# Patient Record
Sex: Female | Born: 1975 | Race: White | Hispanic: No | Marital: Married | Smoking: Never smoker
Health system: Southern US, Community
[De-identification: ages and names within clinical notes are randomized; demographics above are authoritative.]

## PROBLEM LIST (undated history)

## (undated) DIAGNOSIS — E079 Disorder of thyroid, unspecified: Secondary | ICD-10-CM

## (undated) DIAGNOSIS — F329 Major depressive disorder, single episode, unspecified: Secondary | ICD-10-CM

## (undated) DIAGNOSIS — J329 Chronic sinusitis, unspecified: Secondary | ICD-10-CM

## (undated) DIAGNOSIS — Z309 Encounter for contraceptive management, unspecified: Secondary | ICD-10-CM

## (undated) DIAGNOSIS — F419 Anxiety disorder, unspecified: Secondary | ICD-10-CM

## (undated) DIAGNOSIS — F32A Depression, unspecified: Secondary | ICD-10-CM

## (undated) HISTORY — DX: Anxiety disorder, unspecified: F41.9

## (undated) HISTORY — DX: Chronic sinusitis, unspecified: J32.9

## (undated) HISTORY — PX: CHOLECYSTECTOMY: SHX55

## (undated) HISTORY — DX: Unspecified disorder of calcium metabolism: E83.50

## (undated) HISTORY — DX: Encounter for contraceptive management, unspecified: Z30.9

## (undated) HISTORY — DX: Disorder of thyroid, unspecified: E07.9

---

## 2002-08-20 ENCOUNTER — Encounter: Payer: Self-pay | Admitting: Family Medicine

## 2002-08-20 ENCOUNTER — Encounter: Admission: RE | Admit: 2002-08-20 | Discharge: 2002-08-20 | Payer: Self-pay | Admitting: Family Medicine

## 2002-10-06 ENCOUNTER — Encounter: Payer: Self-pay | Admitting: General Surgery

## 2002-10-06 ENCOUNTER — Encounter (INDEPENDENT_AMBULATORY_CARE_PROVIDER_SITE_OTHER): Payer: Self-pay | Admitting: Specialist

## 2002-10-06 ENCOUNTER — Observation Stay (HOSPITAL_COMMUNITY): Admission: RE | Admit: 2002-10-06 | Discharge: 2002-10-06 | Payer: Self-pay | Admitting: General Surgery

## 2005-07-10 HISTORY — PX: EYE SURGERY: SHX253

## 2009-01-12 ENCOUNTER — Other Ambulatory Visit: Admission: RE | Admit: 2009-01-12 | Discharge: 2009-01-12 | Payer: Self-pay | Admitting: Obstetrics and Gynecology

## 2010-02-07 ENCOUNTER — Other Ambulatory Visit: Admission: RE | Admit: 2010-02-07 | Discharge: 2010-02-07 | Payer: Self-pay | Admitting: Obstetrics and Gynecology

## 2010-11-25 NOTE — Op Note (Signed)
NAME:  Andrea Liu, Andrea Liu                         ACCOUNT NO.:  [id number]   MEDICAL RECORD NO.:  [id number]                   PATIENT TYPE:  AMB   LOCATION:  DAY                                  FACILITY:  [facility]   PHYSICIAN:  Timothy E. Earlene Plater, M.D.              DATE OF BIRTH:  Nov 30, 1975   DATE OF PROCEDURE:  10/06/2002  DATE OF DISCHARGE:                                 OPERATIVE REPORT   PREOPERATIVE DIAGNOSIS:  Cholecystolithiasis.   POSTOPERATIVE DIAGNOSIS:  Cholecystolithiasis.   PROCEDURE:  Laparoscopic cholecystectomy and operative cholangiogram.   SURGEON:  Timothy E. Earlene Plater, M.D.   ASSISTANT:  Donnie Coffin. Samuella Cota, M.D.   ANESTHESIA:  CRNA supervised, M.D.   INDICATIONS FOR PROCEDURE:  The patient is 65, otherwise healthy, recent  postpartum, development of symptomatic gallstones documented by ultrasound.  Today her CBC and chemistry profile are normal and pregnancy test is  negative.  She is identified with permit signed.   PROCEDURE:  She was taken to the operating room, placed supine, general  endotracheal anesthesia administered.  The abdomen was prepped and draped in  the usual fashion.  Marcaine 0.25% with epinephrine was used  prior to each  incision.  A vertical infraumbilical incision made.  The fascia identified,  opened vertically.  The peritoneum entered.  The Hasson catheter placed,  tied in place with a 1 Vicryl stitch.  The abdomen insufflated.  General  peritoneoscopy was essentially negative.  There was some blood in the pelvis  that did not come from the Hasson placement.  A second 10-mm trocar placed  in the mid epigastrium, two 5-mm trocars in the right upper quadrant.  The  gallbladder was thickened, it was grasped.  The omental adhesions were taken  down.  Careful evaluation of the infundibulum of the gallbladder revealed a  long, normal appearing cystic duct entering the infundibulum of the  gallbladder.  This was dissected free and a complete window  was made.  An  adjoining anterior artery was separately distended, triply clipped, and  divided.  A clip was placed on the gallbladder side of the cystic duct.  The  cystic duct was opened and then percutaneously a Reddick catheter was placed  into the cystic duct stump.  Then using real-time fluoroscopy, Hypaque half  strength was injected under a real-time fluoroscopy.  The dye flowed freely  and filled the biliary tree completely.  There was no abnormality or  obstruction.  The catheter was removed.  The cystic duct stump was triply  clamped and it was fully divided.  One vascular structure in the bed of the  gallbladder was singly clipped.  The gallbladder was removed from the  gallbladder bed without incident or complication.  There was no bile or  blood.  Irrigant was completely clear.  The gallbladder was removed through  the infraumbilical incision and delivered off of the field, that suture  tied.  Copious irrigation was carried out including the pelvis and all was  clear.  No further bleeding was seen in the pelvis or pooling of blood.  All  instruments, trocars, CO2, and irrigant were  removed under direct vision.  Each skin incision was closed with 3-0  Monocryl and Steri-Strips.  Dry sterile dressing was applied, counts  correct.  She tolerated it well, was awakened and taken to the recovery room  in good condition.                                               Timothy E. Earlene Plater, M.D.    TED/MEDQ  D:  10/06/2002  T:  10/06/2002  Job:  [id number]   cc:   Loney Loh

## 2011-02-27 ENCOUNTER — Other Ambulatory Visit (HOSPITAL_COMMUNITY)
Admission: RE | Admit: 2011-02-27 | Discharge: 2011-02-27 | Disposition: A | Discharge status: Home / Self Care | Payer: Self-pay | Source: Ambulatory Visit | Attending: Obstetrics and Gynecology | Admitting: Obstetrics and Gynecology

## 2011-02-27 ENCOUNTER — Other Ambulatory Visit: Payer: Self-pay | Admitting: Adult Health

## 2011-02-27 DIAGNOSIS — Z01419 Encounter for gynecological examination (general) (routine) without abnormal findings: Secondary | ICD-10-CM | POA: Insufficient documentation

## 2011-12-17 ENCOUNTER — Encounter (HOSPITAL_COMMUNITY): Payer: Self-pay | Admitting: Obstetrics and Gynecology

## 2011-12-17 ENCOUNTER — Inpatient Hospital Stay (HOSPITAL_COMMUNITY)
Admission: AD | Admit: 2011-12-17 | Discharge: 2011-12-17 | Disposition: A | Discharge status: Home / Self Care | Payer: Managed Care, Other (non HMO) | Source: Ambulatory Visit | Attending: Obstetrics & Gynecology | Admitting: Obstetrics & Gynecology

## 2011-12-17 DIAGNOSIS — N760 Acute vaginitis: Secondary | ICD-10-CM | POA: Insufficient documentation

## 2011-12-17 HISTORY — DX: Depression, unspecified: F32.A

## 2011-12-17 HISTORY — DX: Major depressive disorder, single episode, unspecified: F32.9

## 2011-12-17 LAB — URINE MICROSCOPIC-ADD ON

## 2011-12-17 LAB — URINALYSIS, ROUTINE W REFLEX MICROSCOPIC
Bilirubin Urine: NEGATIVE
Leukocytes, UA: NEGATIVE
Nitrite: NEGATIVE
Protein, ur: NEGATIVE mg/dL
Specific Gravity, Urine: <1.005 — ABNORMAL LOW (ref 1.005–1.030)
Urobilinogen, UA: 0.2 mg/dL (ref 0.0–1.0)
pH: 6 (ref 5.0–8.0)

## 2011-12-17 LAB — WET PREP, GENITAL: Yeast Wet Prep HPF POC: NONE SEEN

## 2011-12-17 NOTE — MAU Provider Note (Signed)
History     CSN: [id number]  Arrival date and time: 12/17/11 1445   First Provider Initiated Contact with Patient 12/17/11 1540      Chief Complaint  Patient presents with  . Vaginitis   HPI Andrea Liu is 36 y.o. [id number] Unknown weeks presenting with vaginitis.  She is a patient at [facility].  Spoke to nurse on-call, she spoke Dr. Despina Hidden and instructed to come in.  She states she is having vaginal "rawness".  Leaving tomorrow at 7:30 for camp and cannot wait to be seen in office tomorrow.  Hx of bacterial and yeast infections.  Most of the time it is timed with the beginning of a period.  Last treated with Diflucan last month.  Also has Clobetasol cream "on going prescription" that she uses daily.  Increase stress in2012 and thinks this is the cause "that and hormonal imbalance".  On Sprintec.  She does use latex condoms.    Past Medical History  Diagnosis Date  . Depression     Past Surgical History  Procedure Date  . Eye surgery 2007  . Cholecystectomy     Family History  Problem Relation Age of Onset  . Diabetes Mother   . Hypertension Mother   . Hyperlipidemia Mother   . Cancer Father     History  Substance Use Topics  . Smoking status: Never Smoker   . Smokeless tobacco: Not on file  . Alcohol Use: No    Allergies: No Known Allergies  Prescriptions prior to admission  Medication Sig Dispense Refill  . buPROPion (WELLBUTRIN XL) 150 MG 24 hr tablet Take 150 mg by mouth daily.      . clobetasol cream (TEMOVATE) 0.05 % Apply 1 application topically 2 (two) times daily as needed. For itching, inflammation      . ibuprofen (ADVIL,MOTRIN) 200 MG tablet Take 200 mg by mouth every 6 (six) hours as needed. For pain      . norgestimate-ethinyl estradiol (ORTHO-CYCLEN,SPRINTEC,PREVIFEM) 0.25-35 MG-MCG tablet Take 1 tablet by mouth daily.        Review of Systems  Constitutional: Negative for fever and chills.  Gastrointestinal: Negative for nausea, vomiting and  abdominal pain.  Genitourinary:       Vaginal discomfort   Physical Exam   Blood pressure 128/80, pulse 60, temperature 97 F (36.1 C), temperature source Oral, resp. rate 18, last menstrual period 11/16/2011.  Physical Exam  Constitutional: She is oriented to person, place, and time. She appears well-developed and well-nourished. No distress.  HENT:  Head: Normocephalic.  Neck: Normal range of motion.  Cardiovascular: Normal rate.   Respiratory: Effort normal.  GI: There is no tenderness.  Genitourinary: There is no tenderness or lesion on the right labia. There is no tenderness or lesion on the left labia. Cervix exhibits no motion tenderness, no discharge and no friability. No tenderness or bleeding around the vagina. Vaginal discharge (small amount of white discharge without odor) found.  Neurological: She is alert and oriented to person, place, and time.  Skin: Skin is warm and dry.  Psychiatric: She has a normal mood and affect. Her behavior is normal.   Results for orders placed during the hospital encounter of 12/17/11 (from the past 24 hour(s))  URINALYSIS, ROUTINE W REFLEX MICROSCOPIC     Status: Abnormal   Collection Time   12/17/11  2:46 PM      Component Value Range   Color, Urine YELLOW  YELLOW    APPearance CLEAR  CLEAR  Specific Gravity, Urine <1.005 (*) 1.005 - 1.030    pH 6.0  5.0 - 8.0    Glucose, UA NEGATIVE  NEGATIVE (mg/dL)   Hgb urine dipstick TRACE (*) NEGATIVE    Bilirubin Urine NEGATIVE  NEGATIVE    Ketones, ur NEGATIVE  NEGATIVE (mg/dL)   Protein, ur NEGATIVE  NEGATIVE (mg/dL)   Urobilinogen, UA 0.2  0.0 - 1.0 (mg/dL)   Nitrite NEGATIVE  NEGATIVE    Leukocytes, UA NEGATIVE  NEGATIVE   URINE MICROSCOPIC-ADD ON     Status: Normal   Collection Time   12/17/11  2:46 PM      Component Value Range   WBC, UA 0-2  <3 (WBC/hpf)   RBC / HPF 0-2  <3 (RBC/hpf)  POCT PREGNANCY, URINE     Status: Normal   Collection Time   12/17/11  3:05 PM      Component  Value Range   Preg Test, Ur NEGATIVE  NEGATIVE   WET PREP, GENITAL     Status: Abnormal   Collection Time   12/17/11  3:54 PM      Component Value Range   Yeast Wet Prep HPF POC NONE SEEN  NONE SEEN    Trich, Wet Prep NONE SEEN  NONE SEEN    Clue Cells Wet Prep HPF POC NONE SEEN  NONE SEEN    WBC, Wet Prep HPF POC MODERATE (*) NONE SEEN    MAU Course  Procedures  MDM  Wet prep negative for yeast and bacteria.  Exam is unremarkablee  Assessment and Plan  A:  Vaginitis  P:  Continue medication as Dr. Despina Hidden has prescribes.  Avoid condoms to see if that could be a cause of vaginal discomfort (she is on OCPs)      Follow up with [facility] as needed.  Diann Bangerter,EVE M 12/17/2011, 3:48 PM

## 2011-12-17 NOTE — Discharge Instructions (Signed)
Vaginitis Vaginitis is an infection. It causes soreness, swelling, and redness (inflammation) of the vagina. Many of these infections are sexually transmitted diseases (STDs). Having unprotected sex can cause further problems and complications such as:  Chronic pelvic pain.   Infertility.   Unwanted pregnancy.   Abortion.   Tubal pregnancy.   Infection passed on to the newborn.   Cancer.  CAUSES   Monilia. This is a yeast or fungus infection, not an STD.   Bacterial vaginosis. The normal balance of bacteria in the vagina is disrupted and is replaced by an overgrowth of certain bacteria.   Gonorrhea, chlamydia. These are bacterial infections that are STDs.   Vaginal sponges, diaphragms, and intrauterine devices.   Trichomoniasis. This is a STD infection caused by a parasite.   Viruses like herpes and human papillomavirus. Both are STDs.   Pregnancy.   Immunosuppression. This occurs with certain conditions such as HIV infection or cancer.   Using bubble bath.   Taking certain antibiotic medicines.   Sporadic recurrence can occur if you become sick.   Diabetes.   Steroids.   Allergic reaction. If you have an allergy to:   Douches.   Soaps.   Spermicides.   Condoms.   Scented tampons or vaginal sprays.  SYMPTOMS   Abnormal vaginal discharge.   Itching of the vagina.   Pain in the vagina.   Swelling of the vagina.  In some cases, there are no symptoms. TREATMENT  Treatment will vary depending on the type of infection.  Bacteria or trichomonas are usually treated with oral antibiotics and sometimes vaginal cream or suppositories.   Monilia vaginitis is usually treated with vaginal creams, suppositories, or oral antifungal pills.   Viral vaginitis has no cure. However, the symptoms of herpes (a viral vaginitis) can be treated to relieve the discomfort. Human papillomavirus has no symptoms. However, there are treatments for the diseases caused by human  papillomavirus.   With allergic vaginitis, you need to stop using the product that is causing the problem. Vaginal creams can be used to treat the symptoms.   When treating an STD, the sex partner should also be treated.  HOME CARE INSTRUCTIONS   Take all the medicines as directed by your caregiver.   Do not use scented tampons, soaps, or vaginal sprays.   Do not douche.   Tell your sex partner if you have a vaginal infection or an STD.   Do not have sexual intercourse until you have treated the vaginitis.   Practice safe sex by using condoms.  SEEK MEDICAL CARE IF:   You have abdominal pain.   Your symptoms get worse during treatment.  Document Released: 04/23/2007 Document Revised: 06/15/2011 Document Reviewed: 12/17/2008 [employer] Patient Information 2012 [employer]. 

## 2011-12-17 NOTE — MAU Note (Signed)
Pt presents to MAU with complaints of vaginitis. Pt says she is leaving for out of town tomorrow and was unable to wait to get treatment. Pt says her periods are regular; however everyone before the start of her period she has vaginitis. Pt has taken diflucan twice this week, prescribed by her GYN NP

## 2011-12-25 ENCOUNTER — Emergency Department (HOSPITAL_COMMUNITY)
Admission: EM | Admit: 2011-12-25 | Discharge: 2011-12-25 | Disposition: A | Discharge status: Home / Self Care | Payer: Managed Care, Other (non HMO) | Attending: Emergency Medicine | Admitting: Emergency Medicine

## 2011-12-25 ENCOUNTER — Encounter (HOSPITAL_COMMUNITY): Payer: Self-pay

## 2011-12-25 ENCOUNTER — Emergency Department (HOSPITAL_COMMUNITY): Payer: Managed Care, Other (non HMO)

## 2011-12-25 DIAGNOSIS — Y998 Other external cause status: Secondary | ICD-10-CM | POA: Insufficient documentation

## 2011-12-25 DIAGNOSIS — S20219A Contusion of unspecified front wall of thorax, initial encounter: Secondary | ICD-10-CM

## 2011-12-25 DIAGNOSIS — T24012A Burn of unspecified degree of left thigh, initial encounter: Secondary | ICD-10-CM

## 2011-12-25 DIAGNOSIS — Y93I9 Activity, other involving external motion: Secondary | ICD-10-CM | POA: Insufficient documentation

## 2011-12-25 MED ORDER — OXYCODONE-ACETAMINOPHEN 5-325 MG PO TABS: 1.0000 | ORAL_TABLET | ORAL | Status: AC | PRN

## 2011-12-25 MED ORDER — SILVER SULFADIAZINE 1 % EX CREA: TOPICAL_CREAM | Freq: Every day | CUTANEOUS | Status: AC

## 2011-12-25 MED ORDER — SILVER SULFADIAZINE 1 % EX CREA
TOPICAL_CREAM | Freq: Once | CUTANEOUS | Status: AC
  Administered 2011-12-25: 18:00:00 via TOPICAL
  Filled 2011-12-25: qty 85
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
## 2011-12-25 NOTE — ED Notes (Signed)
Was seen and examined by Dr. Preston Fleeting. Back board and C-Collar was discontinued by Dr. Preston Fleeting.

## 2012-04-16 ENCOUNTER — Other Ambulatory Visit (HOSPITAL_COMMUNITY)
Admission: RE | Admit: 2012-04-16 | Discharge: 2012-04-16 | Disposition: A | Discharge status: Home / Self Care | Payer: Managed Care, Other (non HMO) | Source: Ambulatory Visit | Attending: Obstetrics and Gynecology | Admitting: Obstetrics and Gynecology

## 2012-04-16 ENCOUNTER — Other Ambulatory Visit: Payer: Self-pay | Admitting: Adult Health

## 2012-04-16 DIAGNOSIS — Z01419 Encounter for gynecological examination (general) (routine) without abnormal findings: Secondary | ICD-10-CM | POA: Insufficient documentation

## 2012-04-16 DIAGNOSIS — Z1151 Encounter for screening for human papillomavirus (HPV): Secondary | ICD-10-CM | POA: Insufficient documentation

## 2013-01-06 ENCOUNTER — Telehealth: Payer: Self-pay | Admitting: Adult Health

## 2013-01-06 MED ORDER — FLUCONAZOLE 150 MG PO TABS: ORAL_TABLET | ORAL | Status: DC

## 2013-01-06 NOTE — Telephone Encounter (Signed)
Needs refill on diflucan,will do. 

## 2013-02-16 ENCOUNTER — Other Ambulatory Visit: Payer: Self-pay | Admitting: Adult Health

## 2013-04-07 ENCOUNTER — Encounter: Payer: Self-pay | Admitting: Physician Assistant

## 2013-05-02 ENCOUNTER — Ambulatory Visit (INDEPENDENT_AMBULATORY_CARE_PROVIDER_SITE_OTHER): Payer: Managed Care, Other (non HMO) | Admitting: Adult Health

## 2013-05-02 ENCOUNTER — Encounter (INDEPENDENT_AMBULATORY_CARE_PROVIDER_SITE_OTHER): Payer: Self-pay

## 2013-05-02 ENCOUNTER — Encounter: Payer: Self-pay | Admitting: Adult Health

## 2013-05-02 VITALS — BP 130/80 | HR 78 | Ht 66.0 in | Wt 171.0 lb

## 2013-05-02 DIAGNOSIS — Z309 Encounter for contraceptive management, unspecified: Secondary | ICD-10-CM

## 2013-05-02 DIAGNOSIS — Z01419 Encounter for gynecological examination (general) (routine) without abnormal findings: Secondary | ICD-10-CM

## 2013-05-02 DIAGNOSIS — F419 Anxiety disorder, unspecified: Secondary | ICD-10-CM | POA: Insufficient documentation

## 2013-05-02 HISTORY — DX: Encounter for contraceptive management, unspecified: Z30.9

## 2013-05-02 MED ORDER — NORGESTIMATE-ETH ESTRADIOL 0.25-35 MG-MCG PO TABS: 1.0000 | ORAL_TABLET | Freq: Every day | ORAL | Status: DC

## 2013-05-02 NOTE — Patient Instructions (Signed)
Physical in  1 year Mammogram at 38 

## 2013-05-02 NOTE — Progress Notes (Signed)
Patient ID: Andrea Liu, female   DOB: [birth year], 37 y.o.   MRN: [id number] History of Present Illness: Andrea Liu is a 37 year old white female married in for physical.She had a normal pap with a negative HPV 04/16/12.She has 2 girls and a foster son, who she hopes to adopt. She is happy with her pills.  Current Medications, Allergies, Past Medical History, Past Surgical History, Family History and Social History were reviewed in [employer] record.     Review of Systems: Patient denies any headaches, blurred vision, shortness of breath, chest pain, abdominal pain, problems with bowel movements, urination, or intercourse. No joint pain, has had some anxiety and depression recently, with the adoption process and she has had 2 close friends die suddenly. And she went to a PCP for exam and labs then and was given meds.   Physical Exam:BP 130/80  Pulse 78  Ht 5\' 6"  (1.676 m)  Wt 171 lb (77.565 kg)  BMI 27.61 kg/m2  LMP 04/08/2013 General:  Well developed, well nourished, no acute distress Skin:  Warm and dry Neck:  Midline trachea, normal thyroid Lungs; Clear to auscultation bilaterally Breast:  No dominant palpable mass, retraction, or nipple discharge Cardiovascular: Regular rate and rhythm Abdomen:  Soft, non tender, no hepatosplenomegaly Pelvic:  External genitalia is normal in appearance.  The vagina is normal in appearance.  The cervix is bulbous.  Uterus is felt to be normal size, shape, and contour.  No  adnexal masses or tenderness noted. Extremities:  No swelling or varicosities noted Psych:  Alert and cooperative seems happy   Impression: Yearly gyn exam no pap Contraceptive management Anxiety     Plan: Physical in 1 year Mammogram at 38 Refilled sprintec x 1 year

## 2013-05-28 ENCOUNTER — Ambulatory Visit (HOSPITAL_COMMUNITY)
Admission: RE | Admit: 2013-05-28 | Discharge: 2013-05-28 | Disposition: A | Discharge status: Home / Self Care | Payer: Managed Care, Other (non HMO) | Source: Ambulatory Visit | Attending: Family Medicine | Admitting: Family Medicine

## 2013-05-28 ENCOUNTER — Other Ambulatory Visit (HOSPITAL_COMMUNITY): Payer: Self-pay | Admitting: Family Medicine

## 2013-05-28 DIAGNOSIS — L539 Erythematous condition, unspecified: Secondary | ICD-10-CM | POA: Insufficient documentation

## 2013-05-28 DIAGNOSIS — R609 Edema, unspecified: Secondary | ICD-10-CM | POA: Insufficient documentation

## 2013-05-28 DIAGNOSIS — L02419 Cutaneous abscess of limb, unspecified: Secondary | ICD-10-CM

## 2013-09-07 ENCOUNTER — Other Ambulatory Visit: Payer: Self-pay | Admitting: Adult Health

## 2013-10-21 ENCOUNTER — Other Ambulatory Visit: Payer: Self-pay | Admitting: Adult Health

## 2013-11-06 ENCOUNTER — Other Ambulatory Visit: Payer: Self-pay | Admitting: Adult Health

## 2014-03-25 ENCOUNTER — Other Ambulatory Visit (HOSPITAL_COMMUNITY): Payer: Self-pay | Admitting: Family Medicine

## 2014-03-25 DIAGNOSIS — Z0000 Encounter for general adult medical examination without abnormal findings: Secondary | ICD-10-CM

## 2014-03-30 ENCOUNTER — Ambulatory Visit (HOSPITAL_COMMUNITY): Payer: Managed Care, Other (non HMO)

## 2014-03-30 ENCOUNTER — Ambulatory Visit (HOSPITAL_COMMUNITY)
Admission: RE | Admit: 2014-03-30 | Discharge: 2014-03-30 | Disposition: A | Discharge status: Home / Self Care | Payer: Managed Care, Other (non HMO) | Source: Ambulatory Visit | Attending: Family Medicine | Admitting: Family Medicine

## 2014-03-30 DIAGNOSIS — Z1231 Encounter for screening mammogram for malignant neoplasm of breast: Secondary | ICD-10-CM | POA: Insufficient documentation

## 2014-03-30 DIAGNOSIS — Z0000 Encounter for general adult medical examination without abnormal findings: Secondary | ICD-10-CM

## 2014-04-06 ENCOUNTER — Other Ambulatory Visit: Payer: Self-pay | Admitting: Adult Health

## 2014-04-30 ENCOUNTER — Other Ambulatory Visit: Payer: Self-pay | Admitting: Adult Health

## 2014-05-05 ENCOUNTER — Encounter: Payer: Self-pay | Admitting: Adult Health

## 2014-05-05 ENCOUNTER — Ambulatory Visit (INDEPENDENT_AMBULATORY_CARE_PROVIDER_SITE_OTHER): Payer: Managed Care, Other (non HMO) | Admitting: Adult Health

## 2014-05-05 VITALS — BP 118/70 | HR 76 | Temp 98.3°F | Ht 66.0 in | Wt 176.0 lb

## 2014-05-05 DIAGNOSIS — J0140 Acute pansinusitis, unspecified: Secondary | ICD-10-CM

## 2014-05-05 DIAGNOSIS — Z3041 Encounter for surveillance of contraceptive pills: Secondary | ICD-10-CM

## 2014-05-05 DIAGNOSIS — Z01419 Encounter for gynecological examination (general) (routine) without abnormal findings: Secondary | ICD-10-CM

## 2014-05-05 DIAGNOSIS — F419 Anxiety disorder, unspecified: Secondary | ICD-10-CM

## 2014-05-05 DIAGNOSIS — J329 Chronic sinusitis, unspecified: Secondary | ICD-10-CM | POA: Insufficient documentation

## 2014-05-05 DIAGNOSIS — Z1212 Encounter for screening for malignant neoplasm of rectum: Secondary | ICD-10-CM

## 2014-05-05 HISTORY — DX: Chronic sinusitis, unspecified: J32.9

## 2014-05-05 LAB — HEMOCCULT GUIAC POC 1CARD (OFFICE): FECAL OCCULT BLD: NEGATIVE

## 2014-05-05 MED ORDER — NORGESTIMATE-ETH ESTRADIOL 0.25-35 MG-MCG PO TABS: ORAL_TABLET | ORAL | Status: DC

## 2014-05-05 MED ORDER — BUPROPION HCL ER (SR) 150 MG PO TB12: ORAL_TABLET | ORAL | Status: DC

## 2014-05-05 MED ORDER — AZITHROMYCIN 250 MG PO TABS
ORAL_TABLET | ORAL | Status: DC
Start: 2014-05-05 — End: 2014-07-08

## 2014-05-05 NOTE — Patient Instructions (Signed)
Physical in 1 year Mammogram yearly Increase fluids Rest  Take Z pack Upper Respiratory Infection, Adult An upper respiratory infection (URI) is also known as the common cold. It is often caused by a type of germ (virus). Colds are easily spread (contagious). You can pass it to others by kissing, coughing, sneezing, or drinking out of the same glass. Usually, you get better in 1 or 2 weeks.  HOME CARE   Only take medicine as told by your doctor.  Use a warm mist humidifier or breathe in steam from a hot shower.  Drink enough water and fluids to keep your pee (urine) clear or pale yellow.  Get plenty of rest.  Return to work when your temperature is back to normal or as told by your doctor. You may use a face mask and wash your hands to stop your cold from spreading. GET HELP RIGHT AWAY IF:   After the first few days, you feel you are getting worse.  You have questions about your medicine.  You have chills, shortness of breath, or brown or red spit (mucus).  You have yellow or brown snot (nasal discharge) or pain in the face, especially when you bend forward.  You have a fever, puffy (swollen) neck, pain when you swallow, or white spots in the back of your throat.  You have a bad headache, ear pain, sinus pain, or chest pain.  You have a high-pitched whistling sound when you breathe in and out (wheezing).  You have a lasting cough or cough up blood.  You have sore muscles or a stiff neck. MAKE SURE YOU:   Understand these instructions.  Will watch your condition.  Will get help right away if you are not doing well or get worse. Document Released: 12/13/2007 Document Revised: 09/18/2011 Document Reviewed: 10/01/2013 [facility] Patient Information 2015 [place], [place]. This information is not intended to replace advice given to you by your health care provider. Make sure you discuss any questions you have with your health care provider.

## 2014-05-05 NOTE — Progress Notes (Signed)
Patient ID: Andrea Liu, female   DOB: 1976/07/08, 38 y.o.   MRN: [id number] History of Present Illness: Andrea Liu is a 38 year old white female in for gyn exam,had normal pap with negative HPV 04/16/12.She complains of sinus congestion and tenderness for 1 week,and has green mucous, has taken zyrtec D.   Current Medications, Allergies, Past Medical History, Past Surgical History, Family History and Social History were reviewed in [employer] record.     Review of Systems: Patient denies any headaches, blurred vision, shortness of breath, chest pain, abdominal pain, problems with bowel movements, urination, or intercourse.  No joint pain or mood swings,still trying to adopt little boy,has had for 3 years now.See HPI.   Physical Exam:BP 118/70  Pulse 76  Temp(Src) 98.3 F (36.8 C)  Ht 5\' 6"  (1.676 m)  Wt 176 lb (79.833 kg)  BMI 28.42 kg/m2  LMP 05/02/2014 General:  Well developed, well nourished, no acute distress Skin:  Warm and dry Neck:  Midline trachea, normal thyroid, no swollen lymph nodes,throat red without pustules,ears clear with pearly gray TM, but has tenderness across frontal ,maxillary and ethmoid sinuses. Lungs; Clear to auscultation bilaterally Breast:  No dominant palpable mass, retraction, or nipple discharge Cardiovascular: Regular rate and rhythm Abdomen:  Soft, non tender, no hepatosplenomegaly Pelvic:  External genitalia is normal in appearance.  The vagina has period like blood.    The cervix is bulbous.  Uterus is felt to be normal size, shape, and contour.  No  adnexal masses or tenderness noted. Rectal: Good sphincter tone, no polyps, or hemorrhoids felt.  Hemoccult negative. Extremities:  No swelling or varicosities noted Psych:  No mood changes,alert and cooperative,seems happy   Impression: Well woman gyn exam no pap Sinus infection Contraceptive management Anxiety     Plan: Rx Z pack Refilled Wellbutrin SR 150 mg #90 with 4  refills Refilled sprintec x 1 year Increase fluids and rest Physical and pap in 1 year Mammogram yearly Review handout on URI

## 2014-05-11 ENCOUNTER — Encounter: Payer: Self-pay | Admitting: Adult Health

## 2014-07-08 ENCOUNTER — Telehealth: Payer: Self-pay | Admitting: Adult Health

## 2014-07-08 MED ORDER — AZITHROMYCIN 250 MG PO TABS: ORAL_TABLET | ORAL | Status: DC

## 2014-07-08 NOTE — Telephone Encounter (Signed)
Complains pain and pressure right side face, teeth hurt no fever, had congestion last week with some cough, not now will rx Z pack. If better call for appt

## 2014-07-08 NOTE — Telephone Encounter (Signed)
RX at [facility]

## 2014-07-08 NOTE — Telephone Encounter (Signed)
Spoke with pt. Pt has had sinus symptoms x 5 days. Pt is having pain on the right side of face; stuffy ear. Pt's car is in the shop so she can't get to office. Pt is requesting something to help with symptoms. Please advise!! Thanks!! Hodge

## 2014-11-17 ENCOUNTER — Other Ambulatory Visit: Payer: Self-pay | Admitting: Adult Health

## 2014-12-16 ENCOUNTER — Other Ambulatory Visit: Payer: Self-pay | Admitting: Adult Health

## 2015-02-22 ENCOUNTER — Other Ambulatory Visit: Payer: Self-pay | Admitting: Adult Health

## 2015-03-02 ENCOUNTER — Other Ambulatory Visit: Payer: Self-pay | Admitting: Adult Health

## 2015-03-02 DIAGNOSIS — Z1231 Encounter for screening mammogram for malignant neoplasm of breast: Secondary | ICD-10-CM

## 2015-04-02 ENCOUNTER — Ambulatory Visit (HOSPITAL_COMMUNITY): Payer: Managed Care, Other (non HMO)

## 2015-04-05 ENCOUNTER — Ambulatory Visit (HOSPITAL_COMMUNITY)
Admission: RE | Admit: 2015-04-05 | Discharge: 2015-04-05 | Disposition: A | Discharge status: Home / Self Care | Payer: Managed Care, Other (non HMO) | Source: Ambulatory Visit | Attending: Adult Health | Admitting: Adult Health

## 2015-04-05 ENCOUNTER — Other Ambulatory Visit: Payer: Self-pay | Admitting: Adult Health

## 2015-04-05 ENCOUNTER — Ambulatory Visit (HOSPITAL_COMMUNITY): Payer: Managed Care, Other (non HMO)

## 2015-04-05 DIAGNOSIS — Z1231 Encounter for screening mammogram for malignant neoplasm of breast: Secondary | ICD-10-CM

## 2015-04-10 ENCOUNTER — Other Ambulatory Visit: Payer: Self-pay | Admitting: Adult Health

## 2015-05-11 ENCOUNTER — Encounter: Payer: Self-pay | Admitting: Adult Health

## 2015-05-11 ENCOUNTER — Ambulatory Visit (INDEPENDENT_AMBULATORY_CARE_PROVIDER_SITE_OTHER): Payer: Managed Care, Other (non HMO) | Admitting: Adult Health

## 2015-05-11 ENCOUNTER — Other Ambulatory Visit (HOSPITAL_COMMUNITY)
Admission: RE | Admit: 2015-05-11 | Discharge: 2015-05-11 | Disposition: A | Discharge status: Home / Self Care | Payer: Managed Care, Other (non HMO) | Source: Ambulatory Visit | Attending: Adult Health | Admitting: Adult Health

## 2015-05-11 VITALS — BP 122/80 | HR 74 | Ht 66.0 in | Wt 182.5 lb

## 2015-05-11 DIAGNOSIS — Z01419 Encounter for gynecological examination (general) (routine) without abnormal findings: Secondary | ICD-10-CM

## 2015-05-11 DIAGNOSIS — F419 Anxiety disorder, unspecified: Secondary | ICD-10-CM

## 2015-05-11 DIAGNOSIS — Z3041 Encounter for surveillance of contraceptive pills: Secondary | ICD-10-CM

## 2015-05-11 DIAGNOSIS — Z1212 Encounter for screening for malignant neoplasm of rectum: Secondary | ICD-10-CM

## 2015-05-11 DIAGNOSIS — Z1151 Encounter for screening for human papillomavirus (HPV): Secondary | ICD-10-CM | POA: Insufficient documentation

## 2015-05-11 LAB — HEMOCCULT GUIAC POC 1CARD (OFFICE): FECAL OCCULT BLD: NEGATIVE

## 2015-05-11 MED ORDER — NORGESTIMATE-ETH ESTRADIOL 0.25-35 MG-MCG PO TABS: ORAL_TABLET | ORAL | Status: DC

## 2015-05-11 MED ORDER — BUPROPION HCL ER (SR) 150 MG PO TB12: 150.0000 mg | ORAL_TABLET | Freq: Every day | ORAL | Status: DC

## 2015-05-11 NOTE — Progress Notes (Signed)
Patient ID: Andrea Liu, female   DOB: Jun 04, 1976, 39 y.o.   MRN: [id number] History of Present Illness:  Andrea Liu is a 39 year old white female,married in for a well woman gyn exam and pap.She has had a cough with some congestion,was treated about 3 weeks ago for bacterial sinus infection,she is taking zyrtec D.She is happy with her OCs and Wellbutrin, she did go to counseling and says it helped,and her adoption of Andrea Liu is final. PCP is Scientist, research (physical sciences).  Current Medications, Allergies, Past Medical History, Past Surgical History, Family History and Social History were reviewed in [employer] record.     Review of Systems: Patient denies any headaches, hearing loss, fatigue, blurred vision, shortness of breath, chest pain, abdominal pain, problems with bowel movements, urination, or intercourse. No joint pain or mood swings.See HPI for positives.    Physical Exam:BP 122/80 mmHg  Pulse 74  Ht 5\' 6"  (1.676 m)  Wt 182 lb 8 oz (82.781 kg)  BMI 29.47 kg/m2  LMP 04/26/2015 General:  Well developed, well nourished, no acute distress Skin:  Warm and dry,no sinus tenderness,right ear canal a little red, has good light reflex,throat red, no swelling or pustules. Neck:  Midline trachea, normal thyroid, good ROM, no lymphadenopathy Lungs; Clear to auscultation bilaterally Breast:  No dominant palpable mass, retraction, or nipple discharge Cardiovascular: Regular rate and rhythm Abdomen:  Soft, non tender, no hepatosplenomegaly Pelvic:  External genitalia is normal in appearance, no lesions.  The vagina is normal in appearance. Urethra has no lesions or masses. The cervix is bulbous,and everted at os,pap with HPV performed.  Uterus is felt to be normal size, shape, and contour.  No adnexal masses or tenderness noted.Bladder is non tender, no masses felt. Rectal: Good sphincter tone, no polyps, or hemorrhoids felt.  Hemoccult negative. Extremities/musculoskeletal:  No swelling  or varicosities noted, no clubbing or cyanosis Psych:  No mood changes, alert and cooperative,seems happy   Impression: Well woman gyn exam and pap Contraceptive management Anxiety     Plan: Physical in 1 year Mammogram yearly Refilled sprintec x 1 year Refilled wellbutrin 150 mg SR #90 take 1 daily with 3 refills Continue zyrtec D and push fluids  Call if wants fasting labs in near future

## 2015-05-11 NOTE — Patient Instructions (Signed)
Physical in  1 year Mammogram yearly Take zyrtec D

## 2015-05-13 LAB — CYTOLOGY - PAP

## 2015-05-14 ENCOUNTER — Telehealth: Payer: Self-pay | Admitting: Obstetrics & Gynecology

## 2015-05-14 MED ORDER — AZITHROMYCIN 250 MG PO TABS: ORAL_TABLET | ORAL | Status: DC

## 2015-05-14 NOTE — Telephone Encounter (Addendum)
Pt informed Derrek Monaco, NP not in the office today, will see if another provider would be willing to prescribed the z-pack for cold symptoms. Per Dr. Elonda Husky, Azithromycin 250 mg 2 tablet first day, 1 tablet daily for remainder of pack, #6 no refills.

## 2015-05-14 NOTE — Telephone Encounter (Signed)
Per our conversation OK to eprescribe a z pak

## 2015-09-28 ENCOUNTER — Other Ambulatory Visit: Payer: Self-pay | Admitting: Adult Health

## 2015-12-24 ENCOUNTER — Other Ambulatory Visit: Payer: Self-pay | Admitting: Adult Health

## 2016-02-20 ENCOUNTER — Other Ambulatory Visit: Payer: Self-pay | Admitting: Adult Health

## 2016-04-10 ENCOUNTER — Other Ambulatory Visit: Payer: Self-pay | Admitting: Adult Health

## 2016-04-10 DIAGNOSIS — Z1231 Encounter for screening mammogram for malignant neoplasm of breast: Secondary | ICD-10-CM

## 2016-04-13 ENCOUNTER — Ambulatory Visit (HOSPITAL_COMMUNITY)
Admission: RE | Admit: 2016-04-13 | Discharge: 2016-04-13 | Disposition: A | Discharge status: Home / Self Care | Payer: BLUE CROSS/BLUE SHIELD | Source: Ambulatory Visit | Attending: Adult Health | Admitting: Adult Health

## 2016-04-13 DIAGNOSIS — Z1231 Encounter for screening mammogram for malignant neoplasm of breast: Secondary | ICD-10-CM | POA: Insufficient documentation

## 2016-05-18 ENCOUNTER — Encounter: Payer: Self-pay | Admitting: Adult Health

## 2016-05-18 ENCOUNTER — Ambulatory Visit (INDEPENDENT_AMBULATORY_CARE_PROVIDER_SITE_OTHER): Payer: BLUE CROSS/BLUE SHIELD | Admitting: Adult Health

## 2016-05-18 VITALS — BP 128/72 | HR 64 | Temp 98.2°F | Ht 66.0 in | Wt 190.0 lb

## 2016-05-18 DIAGNOSIS — Z01419 Encounter for gynecological examination (general) (routine) without abnormal findings: Secondary | ICD-10-CM

## 2016-05-18 DIAGNOSIS — J0100 Acute maxillary sinusitis, unspecified: Secondary | ICD-10-CM

## 2016-05-18 DIAGNOSIS — F419 Anxiety disorder, unspecified: Secondary | ICD-10-CM | POA: Diagnosis not present

## 2016-05-18 DIAGNOSIS — Z3041 Encounter for surveillance of contraceptive pills: Secondary | ICD-10-CM

## 2016-05-18 DIAGNOSIS — Z1212 Encounter for screening for malignant neoplasm of rectum: Secondary | ICD-10-CM | POA: Diagnosis not present

## 2016-05-18 DIAGNOSIS — Z01411 Encounter for gynecological examination (general) (routine) with abnormal findings: Secondary | ICD-10-CM | POA: Diagnosis not present

## 2016-05-18 LAB — HEMOCCULT GUIAC POC 1CARD (OFFICE): FECAL OCCULT BLD: NEGATIVE

## 2016-05-18 MED ORDER — NORGESTIMATE-ETH ESTRADIOL 0.25-35 MG-MCG PO TABS: ORAL_TABLET | ORAL | 4 refills | Status: DC

## 2016-05-18 MED ORDER — AZITHROMYCIN 250 MG PO TABS: ORAL_TABLET | ORAL | 0 refills | Status: DC

## 2016-05-18 MED ORDER — FLUCONAZOLE 150 MG PO TABS: ORAL_TABLET | ORAL | 2 refills | Status: DC

## 2016-05-18 NOTE — Patient Instructions (Signed)
Physical in 1 year, pap 2019 Mammogram yearly Labs with PCP

## 2016-05-18 NOTE — Progress Notes (Signed)
Patient ID: Andrea Liu, female   DOB: 1975/12/15, 40 y.o.   MRN: [id number] History of Present Illness:  Andrea Liu is a 40 year old white female, married in for well woman gyn exam,had normal pap with negative HPV 05/11/15.She complains of sinus pressure and green snot and ears stopped up, for about 2 weeks now. PCP is Andrea Brooklyn PA at [facility].  Current Medications, Allergies, Past Medical History, Past Surgical History, Family History and Social History were reviewed in [employer] record.     Review of Systems: Patient denies any headaches, hearing loss, fatigue, blurred vision, shortness of breath, chest pain, abdominal pain, problems with bowel movements, urination, or intercourse. No joint pain or mood swings.See HPI for positives.    Physical Exam:BP 128/72 (BP Location: Left Arm, Patient Position: Sitting, Cuff Size: Normal)   Pulse 64   Temp 98.2 F (36.8 C)   Ht 5\' 6"  (1.676 m)   Wt 190 lb (86.2 kg)   LMP 04/22/2016 (Approximate)   BMI 30.67 kg/m  General:  Well developed, well nourished, no acute distress Skin:  Warm and dry,+sinus tenderness left maxillary and ethmoid, throat clear, without pustules, right ear slight red in canal? Q tip trauma  Neck:  Midline trachea, normal thyroid, good ROM, no lymphadenopathy Lungs; Clear to auscultation bilaterally Breast:  No dominant palpable mass, retraction, or nipple discharge Cardiovascular: Regular rate and rhythm Abdomen:  Soft, non tender, no hepatosplenomegaly Pelvic:  External genitalia is normal in appearance, no lesions.  The vagina is normal in appearance. Urethra has no lesions or masses. The cervix is bulbous.  Uterus is felt to be normal size, shape, and contour.  No adnexal masses or tenderness noted.Bladder is non tender, no masses felt. Rectal: Good sphincter tone, no polyps, or hemorrhoids felt.  Hemoccult negative. Extremities/musculoskeletal:  No swelling or varicosities noted, no clubbing or  cyanosis Psych:  No mood changes, alert and cooperative,seems happy PHQ 2 score 0. She declines the flu shot.  Impression: 1. Well woman exam with routine gynecological exam   2. Encounter for surveillance of contraceptive pills   3. Anxiety   4. Subacute maxillary sinusitis       Plan:  Meds ordered this encounter  Medications  . cetirizine (ZYRTEC) 10 MG tablet    Sig: Take 10 mg by mouth daily.  . norgestimate-ethinyl estradiol (SPRINTEC 28) 0.25-35 MG-MCG tablet    Sig: TAKE AS DIRECTED    Dispense:  84 tablet    Refill:  4    Order Specific Question:   Supervising Provider    Answer:   Andrea Liu, Andrea Liu [[id number]]  . azithromycin (ZITHROMAX) 250 MG tablet    Sig: Take 2 now and 1 daily for 4 days    Dispense:  6 tablet    Refill:  0    Order Specific Question:   Supervising Provider    Answer:   Andrea Liu, Andrea Liu [[id number]]  . fluconazole (DIFLUCAN) 150 MG tablet    Sig: TAKE 1 NOW AND 1 IN 3 DAYS IF NEEDED THEN 1 BEFORE MENSES AS NEEDED    Dispense:  2 tablet    Refill:  2    Order Specific Question:   Supervising Provider    Answer:   Andrea Liu [[id number]]  Continue wellbutrin has refills Physical in 1 year, pap 2019 Mammogram yearly Labs with PCP

## 2016-08-16 ENCOUNTER — Other Ambulatory Visit: Payer: Self-pay | Admitting: Adult Health

## 2016-08-17 ENCOUNTER — Telehealth: Payer: Self-pay | Admitting: Adult Health

## 2016-08-18 MED ORDER — AZITHROMYCIN 250 MG PO TABS: ORAL_TABLET | ORAL | 0 refills | Status: DC

## 2016-08-18 NOTE — Telephone Encounter (Signed)
Pt complains of sinus infection with yellow mucous, will rx z pack

## 2016-08-19 DIAGNOSIS — J019 Acute sinusitis, unspecified: Secondary | ICD-10-CM | POA: Diagnosis not present

## 2016-12-26 DIAGNOSIS — H5032 Intermittent alternating esotropia: Secondary | ICD-10-CM | POA: Diagnosis not present

## 2016-12-26 DIAGNOSIS — H524 Presbyopia: Secondary | ICD-10-CM | POA: Diagnosis not present

## 2017-02-05 DIAGNOSIS — L509 Urticaria, unspecified: Secondary | ICD-10-CM | POA: Diagnosis not present

## 2017-02-13 ENCOUNTER — Other Ambulatory Visit: Payer: Self-pay | Admitting: Adult Health

## 2017-02-13 DIAGNOSIS — L509 Urticaria, unspecified: Secondary | ICD-10-CM | POA: Diagnosis not present

## 2017-03-08 DIAGNOSIS — L509 Urticaria, unspecified: Secondary | ICD-10-CM | POA: Diagnosis not present

## 2017-03-08 DIAGNOSIS — Z0001 Encounter for general adult medical examination with abnormal findings: Secondary | ICD-10-CM | POA: Diagnosis not present

## 2017-03-08 DIAGNOSIS — Z6827 Body mass index (BMI) 27.0-27.9, adult: Secondary | ICD-10-CM | POA: Diagnosis not present

## 2017-03-08 DIAGNOSIS — E663 Overweight: Secondary | ICD-10-CM | POA: Diagnosis not present

## 2017-03-08 DIAGNOSIS — Z1389 Encounter for screening for other disorder: Secondary | ICD-10-CM | POA: Diagnosis not present

## 2017-03-09 ENCOUNTER — Other Ambulatory Visit: Payer: Self-pay | Admitting: Adult Health

## 2017-03-09 DIAGNOSIS — Z1231 Encounter for screening mammogram for malignant neoplasm of breast: Secondary | ICD-10-CM

## 2017-03-13 ENCOUNTER — Encounter: Payer: Self-pay | Admitting: Endocrinology

## 2017-03-15 DIAGNOSIS — L509 Urticaria, unspecified: Secondary | ICD-10-CM | POA: Diagnosis not present

## 2017-03-15 DIAGNOSIS — J309 Allergic rhinitis, unspecified: Secondary | ICD-10-CM | POA: Diagnosis not present

## 2017-03-19 ENCOUNTER — Encounter: Payer: Self-pay | Admitting: Adult Health

## 2017-03-19 ENCOUNTER — Ambulatory Visit (INDEPENDENT_AMBULATORY_CARE_PROVIDER_SITE_OTHER): Payer: BLUE CROSS/BLUE SHIELD | Admitting: Adult Health

## 2017-03-19 VITALS — BP 112/78 | HR 70 | Ht 65.5 in | Wt 176.0 lb

## 2017-03-19 DIAGNOSIS — N898 Other specified noninflammatory disorders of vagina: Secondary | ICD-10-CM | POA: Diagnosis not present

## 2017-03-19 DIAGNOSIS — B379 Candidiasis, unspecified: Secondary | ICD-10-CM | POA: Diagnosis not present

## 2017-03-19 MED ORDER — FLUCONAZOLE 150 MG PO TABS: ORAL_TABLET | ORAL | 3 refills | Status: DC

## 2017-03-19 NOTE — Patient Instructions (Signed)
F/u prn

## 2017-03-19 NOTE — Progress Notes (Signed)
Subjective:     Patient ID: Andrea Liu, female   DOB: 02/23/1976, 41 y.o.   MRN: [id number]  HPI Andrea Liu is a 41 year old white female, married in complaining of vaginal irritation, ?yeast.She has had hives, since 02/03/17 on arms and legs, has been on meds and was found to have elevated calcium level and thyroid issues, is awaiting referral to endocrinology.Has seen allergist and is on meds now.  PCP is [facility].   Review of Systems +Vaginal irritation +vaginal discharge +hives Reviewed past medical,surgical, social and family history. Reviewed medications and allergies.      Objective:   Physical Exam BP 112/78 (BP Location: Left Arm, Patient Position: Sitting, Cuff Size: Normal)   Pulse 70   Ht 5' 5.5" (1.664 m)   Wt 176 lb (79.8 kg)   LMP 02/23/2017 (Approximate)   BMI 28.84 kg/m    Skin warm and dry.Pelvic: external genitalia is normal in appearance no lesions, vagina: white discharge without odor,red sidewalls,urethra has no lesions or masses noted, cervix:smooth and bulbous, uterus: normal size, shape and contour, non tender, no masses felt, adnexa: no masses or tenderness noted. Bladder is non tender and no masses felt. Wet prep: + for yeast and +WBCs. Ok to take OCs continuously.   Assessment:     1. Yeast infection   2. Vaginal irritation   3. Vaginal discharge       Plan:     Rx diflucan 150 mg #2 take 1 now and repeat 1 in 3 days, with 3 refills  F/U prn

## 2017-03-23 ENCOUNTER — Encounter: Payer: Self-pay | Admitting: Endocrinology

## 2017-03-29 ENCOUNTER — Telehealth: Payer: Self-pay | Admitting: General Practice

## 2017-03-29 NOTE — Telephone Encounter (Signed)
Patient calling to check on status of referral. Advised to have referring provider re-fax to [phone].  Please call to verify once it's been received.  Ty,  -LL

## 2017-04-23 ENCOUNTER — Ambulatory Visit (HOSPITAL_COMMUNITY): Payer: BLUE CROSS/BLUE SHIELD

## 2017-04-26 ENCOUNTER — Encounter: Payer: Self-pay | Admitting: Endocrinology

## 2017-04-26 ENCOUNTER — Ambulatory Visit (INDEPENDENT_AMBULATORY_CARE_PROVIDER_SITE_OTHER): Payer: BLUE CROSS/BLUE SHIELD | Admitting: Endocrinology

## 2017-04-26 DIAGNOSIS — E0590 Thyrotoxicosis, unspecified without thyrotoxic crisis or storm: Secondary | ICD-10-CM | POA: Diagnosis not present

## 2017-04-26 LAB — T4, FREE: FREE T4: 0.66 ng/dL (ref 0.60–1.60)

## 2017-04-26 LAB — TSH: TSH: 0.65 u[IU]/mL (ref 0.35–4.50)

## 2017-04-26 LAB — VITAMIN D 25 HYDROXY (VIT D DEFICIENCY, FRACTURES): VITD: 22.92 ng/mL — ABNORMAL LOW (ref 30.00–100.00)

## 2017-04-26 NOTE — Progress Notes (Signed)
Subjective:    Patient ID: Andrea Liu, female    DOB: 08-Aug-1975, 41 y.o.   MRN: [id number]  HPI Pt is referred by Delman Cheadle, PA, for hyperthyroidism.  Pt reports she was first found to have slightly suppressed TSH in 2018.  She has no prior thyroid hx.  she has never been on therapy for this.  she has never had XRT to the anterior neck, or thyroid surgery.  she has never had thyroid imaging.  she does not consume kelp or any other non-prescribed thyroid medication.  she has never been on amiodarone.   Pt is also referred for hypercalcemia.  Pt was noted to have moderate hypercalcemia 2 mos ago (it was normal in).  she has never had osteoporosis, urolithiasis, parathyroid probs, sarcoidosis, cancer, PUD, pancreatitis.  only bony fracture was elbow in the 1990's.  she does not take vitamin-D or A supplements.  Pt denies taking antacids, Li++, or HCTZ. Brother has h/o urolithiasis.   She has frequent urination, and assoc weight loss.  However, pt says she has been trying to lose weight.   Past Medical History:  Diagnosis Date  . Anxiety   . Calcium disorder   . Contraceptive management 05/02/2013  . Depression   . Sinus infection 05/05/2014  . Thyroid disease     Past Surgical History:  Procedure Laterality Date  . CHOLECYSTECTOMY    . EYE SURGERY  2007    Social History   Social History  . Marital status: Married    Spouse name: N/A  . Number of children: N/A  . Years of education: N/A   Occupational History  . Not on file.   Social History Main Topics  . Smoking status: Never Smoker  . Smokeless tobacco: Never Used  . Alcohol use No  . Drug use: No  . Sexual activity: Yes    Birth control/ protection: Condom, Pill   Other Topics Concern  . Not on file   Social History Narrative  . No narrative on file    Current Outpatient Prescriptions on File Prior to Visit  Medication Sig Dispense Refill  . acetaminophen (TYLENOL) 500 MG tablet Take 500 mg by mouth  as needed.    Marland Kitchen buPROPion (WELLBUTRIN SR) 150 MG 12 hr tablet TAKE 1 TABLET BY MOUTH EVERY DAY 90 tablet 3  . clobetasol cream (TEMOVATE) 0.05 % APPLY TO AFFECTED AREA TWICE A DAY FOR 2 WEEKS, THEN USE 2-3 TIMES A WEEK THEREAFTER 30 g 1  . Famotidine (PEPCID PO) Take by mouth 2 (two) times daily.    . fluconazole (DIFLUCAN) 150 MG tablet TAKE 1 NOW AND 1 IN 3 DAYS IF NEEDED THEN 1 BEFORE MENSES AS NEEDED 2 tablet 3  . ibuprofen (ADVIL,MOTRIN) 200 MG tablet Take 200 mg by mouth every 6 (six) hours as needed. For pain    . levocetirizine (XYZAL) 5 MG tablet Take 5 mg by mouth every evening.    . loratadine (CLARITIN) 10 MG tablet Take 10 mg by mouth daily.    . montelukast (SINGULAIR) 10 MG tablet Take 10 mg by mouth at bedtime.    . norgestimate-ethinyl estradiol (SPRINTEC 28) 0.25-35 MG-MCG tablet TAKE AS DIRECTED 84 tablet 4   No current facility-administered medications on file prior to visit.     Allergies  Allergen Reactions  . Latex Rash    Family History  Problem Relation Age of Onset  . Diabetes Mother   . Hypertension Mother   . Hyperlipidemia Mother   .  Cancer Father        CML  . Diabetes Maternal Grandmother   . Heart disease Maternal Grandfather        pacemaker  . Diabetes Brother   . Hypertension Brother   . Heart attack Paternal Grandfather   . Seizures Son   . Other Son        hypotonia, educational delay  . Thyroid disease Neg Hx   . Hypercalcemia Neg Hx     BP 134/88   Pulse 72   Wt 177 lb (80.3 kg)   SpO2 98%   BMI 29.01 kg/m    Review of Systems Depression is well-controlled.  denies weight loss, headache, hoarseness, visual loss, palpitations, sob, diarrhea, muscle weakness, edema, excessive diaphoresis, tremor, anxiety, heat intolerance, easy bruising, and rhinorrhea.  She has irreg and heavy menses    Objective:   Physical Exam VS: see vs page GEN: no distress HEAD: head: no deformity eyes: no periorbital swelling, no proptosis external  nose and ears are normal mouth: no lesion seen NECK: thyroid is slightly enlarged, with irreg surface.  CHEST WALL: no deformity.  No kyphosis.  LUNGS: clear to auscultation CV: reg rate and rhythm, no murmur ABD: abdomen is soft, nontender.  no hepatosplenomegaly.  not distended.  no hernia MUSCULOSKELETAL: muscle bulk and strength are grossly normal.  no obvious joint swelling.  gait is normal and steady EXTEMITIES: no deformity.  no ulcer on the feet.  feet are of normal color and temp.  no edema PULSES: dorsalis pedis intact bilat.  no carotid bruit NEURO:  cn 2-12 grossly intact.   readily moves all 4's.  sensation is intact to touch on the feet.  No tremor.   SKIN:  Normal texture and temperature.  No rash or suspicious lesion is visible.  Not diaphoretic. NODES:  None palpable at the neck PSYCH: alert, well-oriented.  Does not appear anxious nor depressed.  I have reviewed outside records, and summarized: Pt was noted to have abnormal TSH and Ca++, and referred here.  Main symptom was urticaria.  Pt asked if these abnormalities were related to sxs.   outside test results are reviewed: TSH=0.41 Ca++=10.4 PTH=26     Assessment & Plan:  Hypercalcemia, new, uncertain etiology.  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   Patient Instructions  blood tests are requested for you today.  We'll let you know about the results. If the thyroid is again borderline overactive, it needs no treatment now.  we would keep an eye on it, as it can get worse with time.   If the parathyroid is overactive, it also needs no treatment now.  However, this also does not go away.   Please come back for a follow-up appointment in 3 months.

## 2017-04-26 NOTE — Patient Instructions (Signed)
blood tests are requested for you today.  We'll let you know about the results. If the thyroid is again borderline overactive, it needs no treatment now.  we would keep an eye on it, as it can get worse with time.   If the parathyroid is overactive, it also needs no treatment now.  However, this also does not go away.   Please come back for a follow-up appointment in 3 months.

## 2017-04-30 ENCOUNTER — Ambulatory Visit (HOSPITAL_COMMUNITY)
Admission: RE | Admit: 2017-04-30 | Discharge: 2017-04-30 | Disposition: A | Discharge status: Home / Self Care | Payer: BLUE CROSS/BLUE SHIELD | Source: Ambulatory Visit | Attending: Adult Health | Admitting: Adult Health

## 2017-04-30 DIAGNOSIS — Z1231 Encounter for screening mammogram for malignant neoplasm of breast: Secondary | ICD-10-CM | POA: Diagnosis not present

## 2017-05-02 LAB — VITAMIN D 1,25 DIHYDROXY
VITAMIN D 1, 25 (OH) TOTAL: 52 pg/mL (ref 18–72)
Vitamin D2 1, 25 (OH)2: <8 pg/mL
Vitamin D3 1, 25 (OH)2: 52 pg/mL

## 2017-05-02 LAB — PTH-RELATED PEPTIDE: PTH-RELATED PROTEIN (PTH-RP): 10 pg/mL — AB (ref 14–27)

## 2017-05-02 LAB — PTH, INTACT AND CALCIUM
CALCIUM: 9.6 mg/dL (ref 8.6–10.2)
PTH: 33 pg/mL (ref 14–64)

## 2017-05-02 LAB — VITAMIN A: VITAMIN A (RETINOIC ACID): 76 ug/dL (ref 38–98)

## 2017-05-22 ENCOUNTER — Ambulatory Visit (INDEPENDENT_AMBULATORY_CARE_PROVIDER_SITE_OTHER): Payer: BLUE CROSS/BLUE SHIELD | Admitting: Adult Health

## 2017-05-22 ENCOUNTER — Other Ambulatory Visit: Payer: Self-pay

## 2017-05-22 ENCOUNTER — Encounter: Payer: Self-pay | Admitting: Adult Health

## 2017-05-22 VITALS — BP 122/84 | HR 85 | Resp 18 | Ht 66.0 in | Wt 177.0 lb

## 2017-05-22 DIAGNOSIS — Z1212 Encounter for screening for malignant neoplasm of rectum: Secondary | ICD-10-CM | POA: Diagnosis not present

## 2017-05-22 DIAGNOSIS — Z1211 Encounter for screening for malignant neoplasm of colon: Secondary | ICD-10-CM

## 2017-05-22 DIAGNOSIS — Z3041 Encounter for surveillance of contraceptive pills: Secondary | ICD-10-CM | POA: Diagnosis not present

## 2017-05-22 DIAGNOSIS — Z01419 Encounter for gynecological examination (general) (routine) without abnormal findings: Secondary | ICD-10-CM | POA: Diagnosis not present

## 2017-05-22 LAB — HEMOCCULT GUIAC POC 1CARD (OFFICE): Fecal Occult Blood, POC: NEGATIVE

## 2017-05-22 MED ORDER — NORGESTIMATE-ETH ESTRADIOL 0.25-35 MG-MCG PO TABS: ORAL_TABLET | ORAL | 4 refills | Status: DC

## 2017-05-22 NOTE — Progress Notes (Signed)
Patient ID: Andrea Liu, female   DOB: 1975-09-18, 41 y.o.   MRN: [id number] History of Present Illness: Andrea Liu is a 41 year old white female, married in for well woman gyn exam,she had normal pap with negative HPV 05/11/15. PCP is Delman Cheadle PA at [facility].   Current Medications, Allergies, Past Medical History, Past Surgical History, Family History and Social History were reviewed in [employer] record.     Review of Systems: Patient denies any headaches, hearing loss, fatigue, blurred vision, shortness of breath, chest pain, abdominal pain, problems with bowel movements, urination, or intercourse. No joint pain or mood swings.    Physical Exam:BP 122/84 (BP Location: Right Arm, Patient Position: Sitting, Cuff Size: Normal)   Pulse 85   Resp 18   Ht 5\' 6"  (1.676 m)   Wt 177 lb (80.3 kg)   LMP 05/15/2017   BMI 28.57 kg/m  General:  Well developed, well nourished, no acute distress Skin:  Warm and dry Neck:  Midline trachea, normal thyroid, good ROM, no lymphadenopathy Lungs; Clear to auscultation bilaterally Breast:  No dominant palpable mass, retraction, or nipple discharge Cardiovascular: Regular rate and rhythm Abdomen:  Soft, non tender, no hepatosplenomegaly Pelvic:  External genitalia is normal in appearance, no lesions.  The vagina is normal in appearance. Urethra has no lesions or masses. The cervix is bulbous.  Uterus is felt to be normal size, shape, and contour.  No adnexal masses or tenderness noted.Bladder is non tender, no masses felt. Rectal: Good sphincter tone, no polyps, or hemorrhoids felt.  Hemoccult negative. Extremities/musculoskeletal:  No swelling or varicosities noted, no clubbing or cyanosis Psych:  No mood changes, alert and cooperative,seems happy PHQ 2 score 0.  Impression: 1. Encounter for well woman exam with routine gynecological exam   2. Screening for colorectal cancer   3. Encounter for surveillance of  contraceptive pills       Plan: Meds ordered this encounter  Medications  . norgestimate-ethinyl estradiol (SPRINTEC 28) 0.25-35 MG-MCG tablet    Sig: TAKE AS DIRECTED    Dispense:  84 tablet    Refill:  4    Order Specific Question:   Supervising Provider    Answer:   Tania Ade H [[id number]]  Pap and physical in 1 year Mammogram yearly Labs with endocrinologist

## 2017-07-30 ENCOUNTER — Ambulatory Visit: Payer: BLUE CROSS/BLUE SHIELD | Admitting: Endocrinology

## 2017-07-31 ENCOUNTER — Other Ambulatory Visit: Payer: Self-pay | Admitting: Adult Health

## 2017-07-31 ENCOUNTER — Ambulatory Visit: Payer: BLUE CROSS/BLUE SHIELD | Admitting: Endocrinology

## 2017-07-31 ENCOUNTER — Encounter: Payer: Self-pay | Admitting: Endocrinology

## 2017-07-31 VITALS — BP 128/82 | HR 69 | Wt 176.2 lb

## 2017-07-31 DIAGNOSIS — E559 Vitamin D deficiency, unspecified: Secondary | ICD-10-CM

## 2017-07-31 DIAGNOSIS — E0590 Thyrotoxicosis, unspecified without thyrotoxic crisis or storm: Secondary | ICD-10-CM | POA: Diagnosis not present

## 2017-07-31 LAB — T4, FREE: FREE T4: 0.71 ng/dL (ref 0.60–1.60)

## 2017-07-31 LAB — TSH: TSH: 1 u[IU]/mL (ref 0.35–4.50)

## 2017-07-31 LAB — VITAMIN D 25 HYDROXY (VIT D DEFICIENCY, FRACTURES): VITD: 32.72 ng/mL (ref 30.00–100.00)

## 2017-07-31 NOTE — Progress Notes (Signed)
Subjective:    Patient ID: Andrea Liu, female    DOB: July 03, 1976, 42 y.o.   MRN: [id number]  HPI Pt returns for f/u of mild hyperthyroidism (dx'ed 2018; she has never had thyroid imaging; recheck was normal; she has never been on rx for this).  pt denies palpitations.  Pt is also here to f/u hypercalcemia (dx'ed 2016; she has never had osteoporosis, urolithiasis, parathyroid probs).  Denies numbness.  Also, pt has vit-d deficiency (dx'ed 2018).  pt states she feels well in general.  She says she is not at risk for pregnancy.  Past Medical History:  Diagnosis Date  . Anxiety   . Calcium disorder   . Contraceptive management 05/02/2013  . Depression   . Sinus infection 05/05/2014  . Thyroid disease     Past Surgical History:  Procedure Laterality Date  . CHOLECYSTECTOMY    . EYE SURGERY  2007    Social History   Socioeconomic History  . Marital status: Married    Spouse name: Not on file  . Number of children: Not on file  . Years of education: Not on file  . Highest education level: Not on file  Social Needs  . Financial resource strain: Not on file  . Food insecurity - worry: Not on file  . Food insecurity - inability: Not on file  . Transportation needs - medical: Not on file  . Transportation needs - non-medical: Not on file  Occupational History  . Not on file  Tobacco Use  . Smoking status: Never Smoker  . Smokeless tobacco: Never Used  Substance and Sexual Activity  . Alcohol use: No  . Drug use: No  . Sexual activity: Yes    Birth control/protection: Condom, Pill  Other Topics Concern  . Not on file  Social History Narrative  . Not on file    Current Outpatient Medications on File Prior to Visit  Medication Sig Dispense Refill  . acetaminophen (TYLENOL) 500 MG tablet Take 500 mg by mouth as needed.    Marland Kitchen buPROPion (WELLBUTRIN SR) 150 MG 12 hr tablet TAKE 1 TABLET BY MOUTH EVERY DAY 90 tablet 3  . clobetasol cream (TEMOVATE) 0.05 % APPLY TO  AFFECTED AREA TWICE A DAY FOR 2 WEEKS, THEN USE 2-3 TIMES A WEEK THEREAFTER 30 g 1  . Famotidine (PEPCID PO) Take by mouth once.     Marland Kitchen levocetirizine (XYZAL) 5 MG tablet Take 5 mg by mouth every evening.    . loratadine (CLARITIN) 10 MG tablet Take 10 mg by mouth daily.    . montelukast (SINGULAIR) 10 MG tablet Take 10 mg by mouth at bedtime.    . norgestimate-ethinyl estradiol (SPRINTEC 28) 0.25-35 MG-MCG tablet TAKE AS DIRECTED 84 tablet 4  . ibuprofen (ADVIL,MOTRIN) 200 MG tablet Take 200 mg by mouth every 6 (six) hours as needed. For pain     No current facility-administered medications on file prior to visit.     Allergies  Allergen Reactions  . Latex Rash    Family History  Problem Relation Age of Onset  . Diabetes Mother   . Hypertension Mother   . Hyperlipidemia Mother   . Cancer Father        CML  . Diabetes Maternal Grandmother   . Heart disease Maternal Grandfather        pacemaker  . Diabetes Brother   . Hypertension Brother   . Heart attack Paternal Grandfather   . Seizures Son   . Other Son  hypotonia, educational delay  . Thyroid disease Neg Hx   . Hypercalcemia Neg Hx     BP 128/82 (BP Location: Left Arm, Patient Position: Sitting, Cuff Size: Normal)   Pulse 69   Wt 176 lb 3.2 oz (79.9 kg)   SpO2 97%   BMI 28.44 kg/m   Review of Systems Denies tremor and excessive diaphoresis.     Objective:   Physical Exam VS: see vs page GEN: no distress NECK: thyroid is slightly enlarged, but there is no palpable nodule.      Assessment & Plan:  Hyperthyroidism: due to recheck Hypercalcemia: due for recheck vit-d deficiency: due for recheck.  Patient Instructions  blood tests are requested for you today.  We'll let you know about the results. Please come back for a follow-up appointment in 6 months most of the time, a "lumpy thyroid" will eventually become overactive.  this is usually a slow process, happening over the span of many years.

## 2017-07-31 NOTE — Patient Instructions (Signed)
blood tests are requested for you today.  We'll let you know about the results. Please come back for a follow-up appointment in 6 months most of the time, a "lumpy thyroid" will eventually become overactive.  this is usually a slow process, happening over the span of many years.

## 2017-08-01 LAB — PTH, INTACT AND CALCIUM
CALCIUM: 9.9 mg/dL (ref 8.6–10.2)
PTH: 27 pg/mL (ref 14–64)

## 2017-08-27 ENCOUNTER — Telehealth: Payer: Self-pay | Admitting: *Deleted

## 2017-08-27 MED ORDER — AZITHROMYCIN 250 MG PO TABS: ORAL_TABLET | ORAL | 0 refills | Status: DC

## 2017-08-27 NOTE — Telephone Encounter (Signed)
Pt has symptoms of sinus infection has had before, will rx z pack

## 2017-09-04 DIAGNOSIS — J0100 Acute maxillary sinusitis, unspecified: Secondary | ICD-10-CM | POA: Diagnosis not present

## 2017-09-04 DIAGNOSIS — Z1389 Encounter for screening for other disorder: Secondary | ICD-10-CM | POA: Diagnosis not present

## 2017-09-04 DIAGNOSIS — E663 Overweight: Secondary | ICD-10-CM | POA: Diagnosis not present

## 2017-09-04 DIAGNOSIS — Z6827 Body mass index (BMI) 27.0-27.9, adult: Secondary | ICD-10-CM | POA: Diagnosis not present

## 2017-12-05 ENCOUNTER — Other Ambulatory Visit: Payer: Self-pay | Admitting: Adult Health

## 2018-01-28 ENCOUNTER — Ambulatory Visit: Payer: BLUE CROSS/BLUE SHIELD | Admitting: Endocrinology

## 2018-01-30 ENCOUNTER — Ambulatory Visit: Payer: BLUE CROSS/BLUE SHIELD | Admitting: Endocrinology

## 2018-01-30 ENCOUNTER — Encounter: Payer: Self-pay | Admitting: Endocrinology

## 2018-01-30 DIAGNOSIS — E559 Vitamin D deficiency, unspecified: Secondary | ICD-10-CM

## 2018-01-30 DIAGNOSIS — E0590 Thyrotoxicosis, unspecified without thyrotoxic crisis or storm: Secondary | ICD-10-CM

## 2018-01-30 LAB — VITAMIN D 25 HYDROXY (VIT D DEFICIENCY, FRACTURES): VITD: 30.37 ng/mL (ref 30.00–100.00)

## 2018-01-30 LAB — TSH: TSH: 0.65 u[IU]/mL (ref 0.35–4.50)

## 2018-01-30 NOTE — Progress Notes (Signed)
Subjective:    Patient ID: Andrea Liu, female    DOB: [birth year], 42 y.o.   MRN: [id number]  HPI Pt returns for f/u of mild hyperthyroidism (dx'ed 2018; she has never had thyroid imaging; recheck was normal; she has never been on rx for this).  pt denies palpitations.  Past Medical History:  Diagnosis Date  . Anxiety   . Calcium disorder   . Contraceptive management 05/02/2013  . Depression   . Sinus infection 05/05/2014  . Thyroid disease     Past Surgical History:  Procedure Laterality Date  . CHOLECYSTECTOMY    . EYE SURGERY  2007    Social History   Socioeconomic History  . Marital status: Married    Spouse name: Not on file  . Number of children: Not on file  . Years of education: Not on file  . Highest education level: Not on file  Occupational History  . Not on file  Social Needs  . Financial resource strain: Not on file  . Food insecurity:    Worry: Not on file    Inability: Not on file  . Transportation needs:    Medical: Not on file    Non-medical: Not on file  Tobacco Use  . Smoking status: Never Smoker  . Smokeless tobacco: Never Used  Substance and Sexual Activity  . Alcohol use: No  . Drug use: No  . Sexual activity: Yes    Birth control/protection: Condom, Pill  Lifestyle  . Physical activity:    Days per week: Not on file    Minutes per session: Not on file  . Stress: Not on file  Relationships  . Social connections:    Talks on phone: Not on file    Gets together: Not on file    Attends religious service: Not on file    Active member of club or organization: Not on file    Attends meetings of clubs or organizations: Not on file    Relationship status: Not on file  . Intimate partner violence:    Fear of current or ex partner: Not on file    Emotionally abused: Not on file    Physically abused: Not on file    Forced sexual activity: Not on file  Other Topics Concern  . Not on file  Social History Narrative  . Not on file     Current Outpatient Medications on File Prior to Visit  Medication Sig Dispense Refill  . Cholecalciferol (VITAMIN D) 2000 units CAPS Take by mouth.    Marland Kitchen acetaminophen (TYLENOL) 500 MG tablet Take 500 mg by mouth as needed.    Marland Kitchen buPROPion (WELLBUTRIN SR) 150 MG 12 hr tablet TAKE 1 TABLET BY MOUTH EVERY DAY 90 tablet 3  . clobetasol cream (TEMOVATE) 0.05 % APPLY TO AFFECTED AREA TWICE A DAY FOR 2 WEEKS, THEN USE 2-3 TIMES A WEEK THEREAFTER 30 g 1  . Famotidine (PEPCID PO) Take by mouth once.     . fluconazole (DIFLUCAN) 150 MG tablet TAKE 1 TABLET BY MOUTH NOW AND 1 IN 3 DAYS IF NEEDED THEN 1 BEFORE MENSES AS NEEDED 2 tablet 3  . ibuprofen (ADVIL,MOTRIN) 200 MG tablet Take 200 mg by mouth every 6 (six) hours as needed. For pain    . levocetirizine (XYZAL) 5 MG tablet Take 5 mg by mouth every evening.    . loratadine (CLARITIN) 10 MG tablet Take 10 mg by mouth daily.    . montelukast (SINGULAIR) 10 MG tablet  Take 10 mg by mouth at bedtime.    . norgestimate-ethinyl estradiol (SPRINTEC 28) 0.25-35 MG-MCG tablet TAKE AS DIRECTED 84 tablet 4   No current facility-administered medications on file prior to visit.     Allergies  Allergen Reactions  . Latex Rash    Family History  Problem Relation Age of Onset  . Diabetes Mother   . Hypertension Mother   . Hyperlipidemia Mother   . Cancer Father        CML  . Diabetes Maternal Grandmother   . Heart disease Maternal Grandfather        pacemaker  . Diabetes Brother   . Hypertension Brother   . Heart attack Paternal Grandfather   . Seizures Son   . Other Son        hypotonia, educational delay  . Thyroid disease Neg Hx   . Hypercalcemia Neg Hx     BP 122/72 (BP Location: Left Arm, Patient Position: Sitting, Cuff Size: Normal)   Pulse 69   Ht 5\' 7"  (1.702 m)   Wt 174 lb 3.2 oz (79 kg)   SpO2 98%   BMI 27.28 kg/m    Review of Systems Denies cramps.      Objective:   Physical Exam VS: see vs page GEN: no distress NECK:  thyroid is slightly enlarged, with a irreg surface, but there is no palpable nodule.       Assessment & Plan:  Hyperthyroidism: recheck today Vit-D def: recheck today  Patient Instructions  blood tests are requested for you today.  We'll let you know about the results. If these are normal, all you need to do is to have your annual blood tests. I would be happy to see you back here as needed.

## 2018-01-30 NOTE — Patient Instructions (Addendum)
blood tests are requested for you today.  We'll let you know about the results. If these are normal, all you need to do is to have your annual blood tests. I would be happy to see you back here as needed.

## 2018-01-31 LAB — PTH, INTACT AND CALCIUM
CALCIUM: 10.1 mg/dL (ref 8.6–10.2)
PTH: 27 pg/mL (ref 14–64)

## 2018-02-11 ENCOUNTER — Ambulatory Visit (INDEPENDENT_AMBULATORY_CARE_PROVIDER_SITE_OTHER): Payer: BLUE CROSS/BLUE SHIELD | Admitting: Adult Health

## 2018-02-11 ENCOUNTER — Encounter: Payer: Self-pay | Admitting: Adult Health

## 2018-02-11 VITALS — BP 103/67 | HR 63 | Ht 66.0 in | Wt 173.0 lb

## 2018-02-11 DIAGNOSIS — R10813 Right lower quadrant abdominal tenderness: Secondary | ICD-10-CM | POA: Diagnosis not present

## 2018-02-11 DIAGNOSIS — R350 Frequency of micturition: Secondary | ICD-10-CM | POA: Diagnosis not present

## 2018-02-11 LAB — POCT URINALYSIS DIPSTICK
Blood, UA: NEGATIVE
Glucose, UA: NEGATIVE
Leukocytes, UA: NEGATIVE
Nitrite, UA: NEGATIVE
Protein, UA: NEGATIVE

## 2018-02-11 NOTE — Progress Notes (Signed)
  Subjective:     Patient ID: Whitney Muse, female   DOB: [birth year], 42 y.o.   MRN: [id number]  HPI Nova is a 42 year old white female, married in complaining of +UTI, has urinary frequency and burning in right groin area.and has noticed thicker discharge, white and has used diflucan recently.   Review of Systems +urinary frequency +burning like in right groin area +change in discharge, seems thicker Reviewed past medical,surgical, social and family history. Reviewed medications and allergies.     Objective:   Physical Exam BP 103/67 (BP Location: Left Arm, Patient Position: Sitting, Cuff Size: Normal)   Pulse 63   Ht 5\' 6"  (1.676 m)   Wt 173 lb (78.5 kg)   LMP 01/21/2018   BMI 27.92 kg/m urine dipstick negative.   Skin warm and dry.Pelvic: external genitalia is normal in appearance no lesions, vagina: scant discharge without odor,urethra has no lesions or masses noted, cervix:smooth and bulbous, uterus: normal size, shape and contour, non tender, no masses felt, adnexa: no masses, +RLQ tenderness noted. Bladder is non tender and no masses felt.   Assessment:     1. Urinary frequency   2. Right lower quadrant abdominal tenderness without rebound tenderness       Plan:     Push water UA C&S sent Return in 1 week for GYN [place]

## 2018-02-12 LAB — URINALYSIS, ROUTINE W REFLEX MICROSCOPIC
Bilirubin, UA: NEGATIVE
Glucose, UA: NEGATIVE
KETONES UA: NEGATIVE
Leukocytes, UA: NEGATIVE
NITRITE UA: NEGATIVE
Protein, UA: NEGATIVE
RBC UA: NEGATIVE
SPEC GRAV UA: 1.007 (ref 1.005–1.030)
Urobilinogen, Ur: 0.2 mg/dL (ref 0.2–1.0)
pH, UA: 8 — ABNORMAL HIGH (ref 5.0–7.5)

## 2018-02-13 LAB — URINE CULTURE

## 2018-02-18 ENCOUNTER — Ambulatory Visit (INDEPENDENT_AMBULATORY_CARE_PROVIDER_SITE_OTHER): Payer: BLUE CROSS/BLUE SHIELD

## 2018-02-18 DIAGNOSIS — R10813 Right lower quadrant abdominal tenderness: Secondary | ICD-10-CM | POA: Diagnosis not present

## 2018-02-18 NOTE — Progress Notes (Signed)
PELVIC US TA/TV: homogeneous anteverted uterus,wnl,EEC 5.8 mm,normal ovaries bilat,no free fluid,ovaries appear mobile,no pain during ultrasound

## 2018-02-19 ENCOUNTER — Telehealth: Payer: Self-pay | Admitting: Adult Health

## 2018-02-19 NOTE — Telephone Encounter (Signed)
Pt aware that [place] is normal

## 2018-03-14 ENCOUNTER — Other Ambulatory Visit: Payer: Self-pay | Admitting: Adult Health

## 2018-03-20 ENCOUNTER — Other Ambulatory Visit (HOSPITAL_COMMUNITY): Payer: Self-pay | Admitting: Family Medicine

## 2018-03-20 DIAGNOSIS — Z1231 Encounter for screening mammogram for malignant neoplasm of breast: Secondary | ICD-10-CM

## 2018-04-22 ENCOUNTER — Other Ambulatory Visit: Payer: Self-pay | Admitting: Adult Health

## 2018-05-02 ENCOUNTER — Ambulatory Visit (HOSPITAL_COMMUNITY)
Admission: RE | Admit: 2018-05-02 | Discharge: 2018-05-02 | Disposition: A | Discharge status: Home / Self Care | Payer: BLUE CROSS/BLUE SHIELD | Source: Ambulatory Visit | Attending: Family Medicine | Admitting: Family Medicine

## 2018-05-02 DIAGNOSIS — Z1231 Encounter for screening mammogram for malignant neoplasm of breast: Secondary | ICD-10-CM | POA: Insufficient documentation

## 2018-06-17 ENCOUNTER — Other Ambulatory Visit: Payer: Self-pay | Admitting: Adult Health

## 2018-07-06 IMAGING — MG 2D DIGITAL SCREENING BILATERAL MAMMOGRAM WITH CAD AND ADJUNCT TO
8 series · 8 of 24 positions shown · non-contrast
Comparison: Previous exam(s).

CLINICAL DATA: Screening.

EXAM:
2D DIGITAL SCREENING BILATERAL MAMMOGRAM WITH CAD AND ADJUNCT TOMO

[R CC]
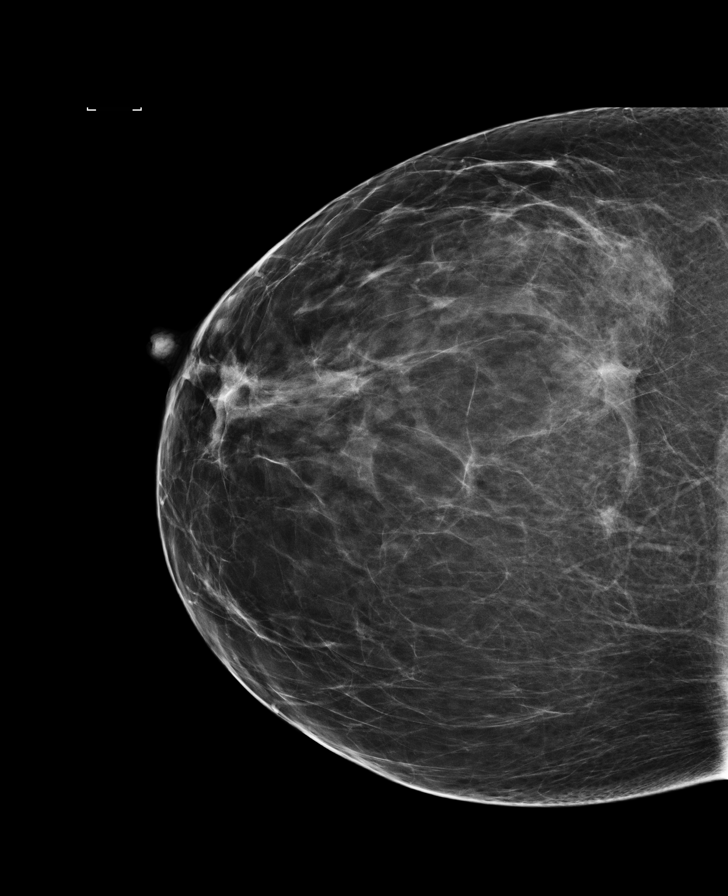

[L CC]
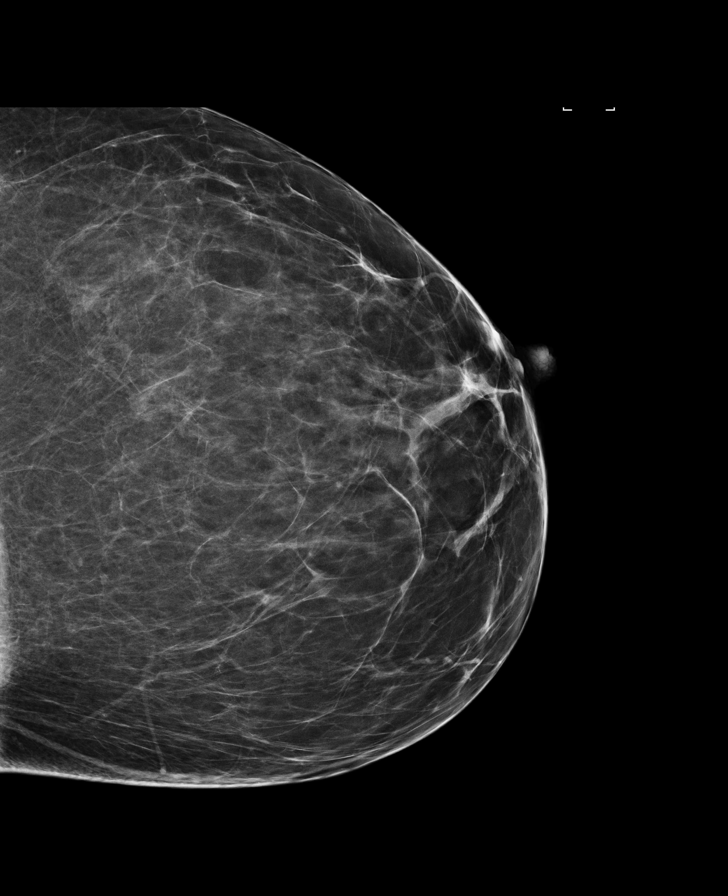

[L MLO]
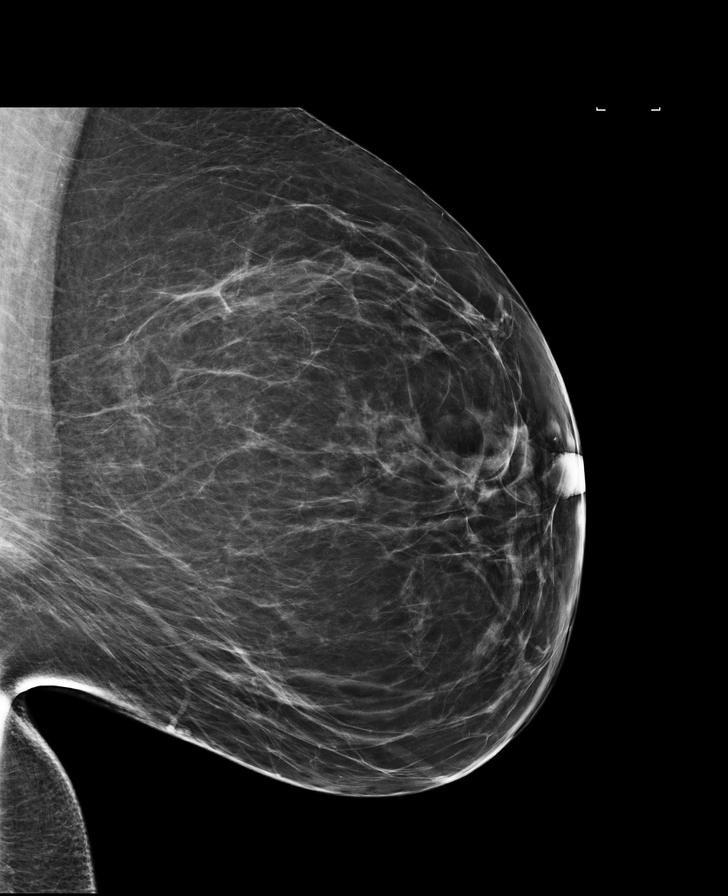

[R MLO]
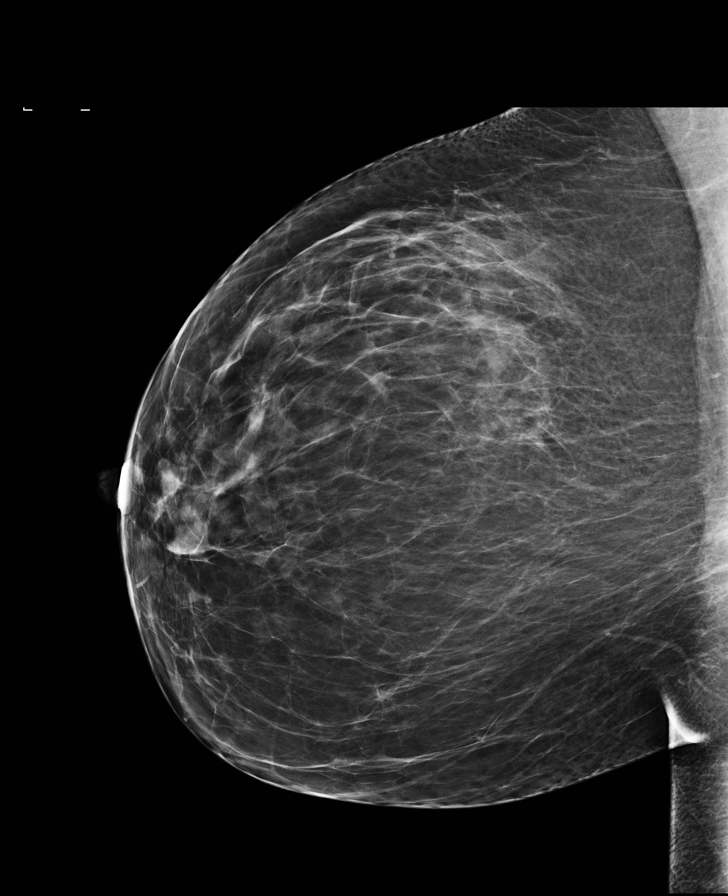

[R CC tomo · tomo slice 37/74.0]
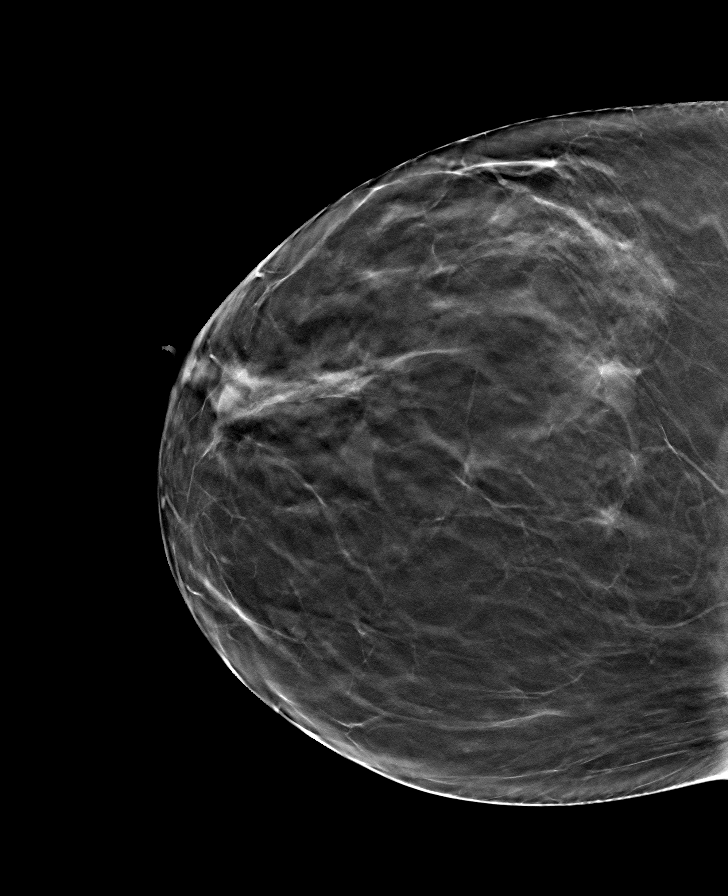

[R MLO tomo · tomo slice 40/79.0]
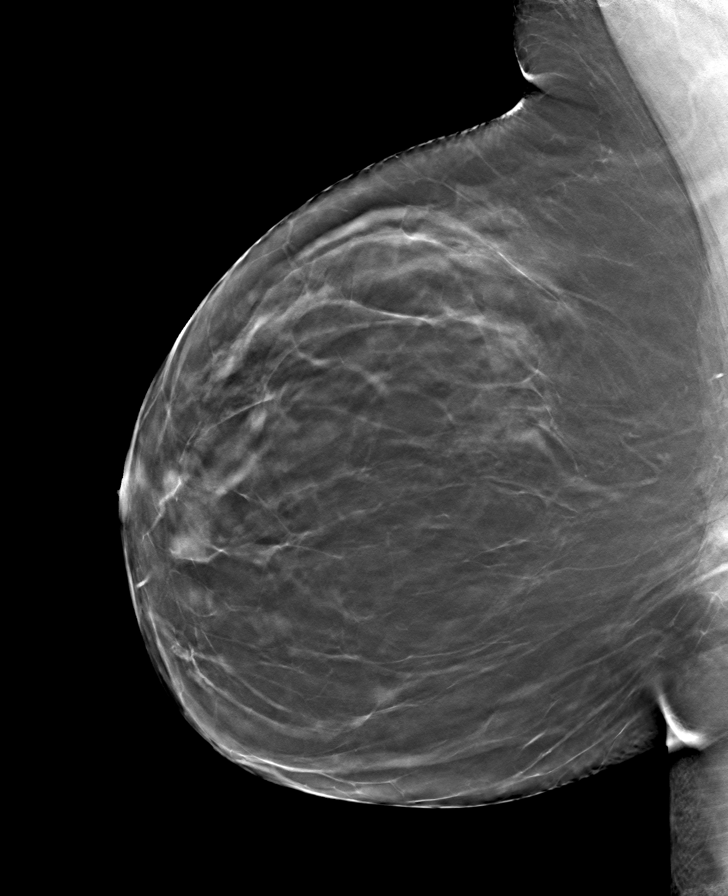

[L MLO tomo · tomo slice 39/78.0]
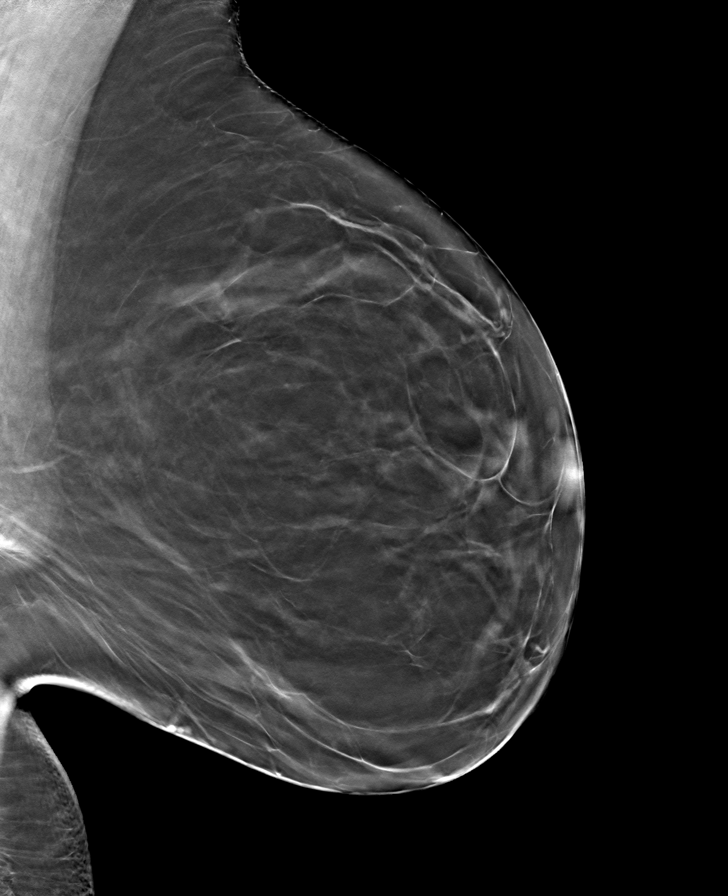

[L CC tomo · tomo slice 37/74.0]
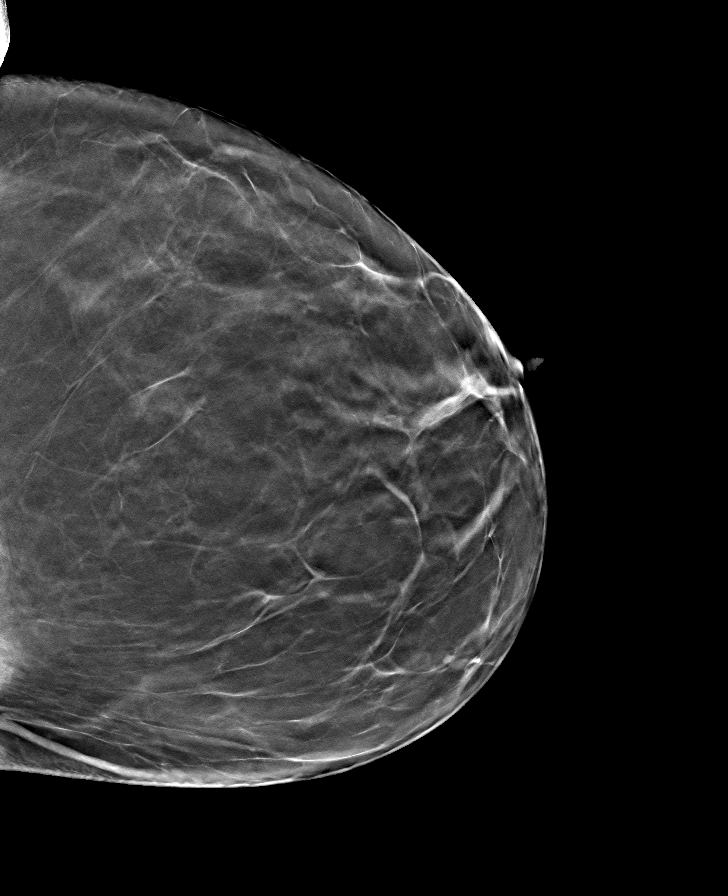

[8 of 24 positions shown; findings below may reference images not displayed]

ACR Breast Density Category b: There are scattered areas of
fibroglandular density.
FINDINGS: There are no findings suspicious for malignancy. Images were
processed with CAD.
IMPRESSION: No mammographic evidence of malignancy. A result letter of this
screening mammogram will be mailed directly to the patient.

RECOMMENDATION:
Screening mammogram in one year. (Code:97-6-RS4)

BI-RADS CATEGORY  1: Negative.

## 2018-07-09 ENCOUNTER — Telehealth: Payer: Self-pay | Admitting: Adult Health

## 2018-07-09 MED ORDER — AZITHROMYCIN 250 MG PO TABS: ORAL_TABLET | ORAL | 0 refills | Status: DC

## 2018-07-09 NOTE — Telephone Encounter (Signed)
Pt complains of colored nasal mucous and congestion, Rx sent for Z pak, push fluids

## 2018-07-09 NOTE — Telephone Encounter (Signed)
Pt called and wanted to know if you can call in a z pack for her / to [facility] in [place]

## 2018-09-04 DIAGNOSIS — H5319 Other subjective visual disturbances: Secondary | ICD-10-CM | POA: Diagnosis not present

## 2018-09-04 DIAGNOSIS — H5032 Intermittent alternating esotropia: Secondary | ICD-10-CM | POA: Diagnosis not present

## 2018-11-26 DIAGNOSIS — J329 Chronic sinusitis, unspecified: Secondary | ICD-10-CM | POA: Diagnosis not present

## 2018-11-26 DIAGNOSIS — Z681 Body mass index (BMI) 19 or less, adult: Secondary | ICD-10-CM | POA: Diagnosis not present

## 2018-12-30 ENCOUNTER — Other Ambulatory Visit: Payer: Self-pay | Admitting: Adult Health

## 2019-03-24 ENCOUNTER — Other Ambulatory Visit: Payer: Self-pay | Admitting: Adult Health

## 2019-03-25 ENCOUNTER — Other Ambulatory Visit (HOSPITAL_COMMUNITY): Payer: Self-pay | Admitting: Adult Health

## 2019-03-25 DIAGNOSIS — Z1231 Encounter for screening mammogram for malignant neoplasm of breast: Secondary | ICD-10-CM

## 2019-03-27 ENCOUNTER — Other Ambulatory Visit: Payer: Self-pay | Admitting: *Deleted

## 2019-03-27 MED ORDER — CLOBETASOL PROPIONATE 0.05 % EX CREA: TOPICAL_CREAM | CUTANEOUS | 0 refills | Status: DC

## 2019-03-27 NOTE — Addendum Note (Signed)
Addended by: Christiana Pellant A on: 03/27/2019 03:24 PM   Modules accepted: Orders

## 2019-03-27 NOTE — Telephone Encounter (Signed)
Pt of Jennifer requesting refill on the clobetasol cream.

## 2019-03-27 NOTE — Telephone Encounter (Signed)
Patient is requesting a refill on clobetasol cream.

## 2019-04-03 DIAGNOSIS — E663 Overweight: Secondary | ICD-10-CM | POA: Diagnosis not present

## 2019-04-03 DIAGNOSIS — Z6827 Body mass index (BMI) 27.0-27.9, adult: Secondary | ICD-10-CM | POA: Diagnosis not present

## 2019-04-03 DIAGNOSIS — Z0001 Encounter for general adult medical examination with abnormal findings: Secondary | ICD-10-CM | POA: Diagnosis not present

## 2019-04-27 ENCOUNTER — Other Ambulatory Visit: Payer: Self-pay | Admitting: Adult Health

## 2019-05-05 ENCOUNTER — Ambulatory Visit (HOSPITAL_COMMUNITY)
Admission: RE | Admit: 2019-05-05 | Discharge: 2019-05-05 | Disposition: A | Discharge status: Home / Self Care | Payer: BC Managed Care – PPO | Source: Ambulatory Visit | Attending: Adult Health | Admitting: Adult Health

## 2019-05-05 ENCOUNTER — Other Ambulatory Visit: Payer: Self-pay

## 2019-05-05 ENCOUNTER — Other Ambulatory Visit (HOSPITAL_COMMUNITY): Payer: Self-pay | Admitting: Adult Health

## 2019-05-05 DIAGNOSIS — Z1231 Encounter for screening mammogram for malignant neoplasm of breast: Secondary | ICD-10-CM

## 2019-05-05 DIAGNOSIS — R928 Other abnormal and inconclusive findings on diagnostic imaging of breast: Secondary | ICD-10-CM

## 2019-05-06 ENCOUNTER — Ambulatory Visit (HOSPITAL_COMMUNITY)
Admission: RE | Admit: 2019-05-06 | Discharge: 2019-05-06 | Disposition: A | Discharge status: Home / Self Care | Payer: BC Managed Care – PPO | Source: Ambulatory Visit | Attending: Adult Health | Admitting: Adult Health

## 2019-05-06 DIAGNOSIS — R928 Other abnormal and inconclusive findings on diagnostic imaging of breast: Secondary | ICD-10-CM

## 2019-05-06 DIAGNOSIS — N6489 Other specified disorders of breast: Secondary | ICD-10-CM | POA: Diagnosis not present

## 2019-05-21 ENCOUNTER — Telehealth: Payer: Self-pay | Admitting: *Deleted

## 2019-05-21 NOTE — Telephone Encounter (Signed)
Pt got my chart message to take 1 Wellbutrin in am and 1 in pm

## 2019-05-21 NOTE — Telephone Encounter (Signed)
Pt left message that she needs to speak with Anderson Malta about a medication.

## 2019-05-22 ENCOUNTER — Other Ambulatory Visit: Payer: Self-pay | Admitting: Women's Health

## 2019-06-14 ENCOUNTER — Other Ambulatory Visit: Payer: Self-pay | Admitting: Women's Health

## 2019-06-18 ENCOUNTER — Other Ambulatory Visit: Payer: Self-pay

## 2019-06-18 ENCOUNTER — Encounter: Payer: Self-pay | Admitting: Adult Health

## 2019-06-18 ENCOUNTER — Ambulatory Visit (INDEPENDENT_AMBULATORY_CARE_PROVIDER_SITE_OTHER): Payer: BC Managed Care – PPO | Admitting: Adult Health

## 2019-06-18 ENCOUNTER — Other Ambulatory Visit (HOSPITAL_COMMUNITY)
Admission: RE | Admit: 2019-06-18 | Discharge: 2019-06-18 | Disposition: A | Discharge status: Home / Self Care | Payer: BC Managed Care – PPO | Source: Ambulatory Visit | Attending: Adult Health | Admitting: Adult Health

## 2019-06-18 VITALS — BP 125/75 | HR 79 | Ht 65.0 in | Wt 172.0 lb

## 2019-06-18 DIAGNOSIS — Z01419 Encounter for gynecological examination (general) (routine) without abnormal findings: Secondary | ICD-10-CM

## 2019-06-18 DIAGNOSIS — Z1211 Encounter for screening for malignant neoplasm of colon: Secondary | ICD-10-CM

## 2019-06-18 DIAGNOSIS — N951 Menopausal and female climacteric states: Secondary | ICD-10-CM

## 2019-06-18 DIAGNOSIS — Z1212 Encounter for screening for malignant neoplasm of rectum: Secondary | ICD-10-CM | POA: Diagnosis not present

## 2019-06-18 DIAGNOSIS — Z3041 Encounter for surveillance of contraceptive pills: Secondary | ICD-10-CM

## 2019-06-18 LAB — HEMOCCULT GUIAC POC 1CARD (OFFICE): Fecal Occult Blood, POC: NEGATIVE

## 2019-06-18 MED ORDER — NORGESTIMATE-ETH ESTRADIOL 0.25-35 MG-MCG PO TABS: ORAL_TABLET | ORAL | 4 refills | Status: DC

## 2019-06-18 MED ORDER — BUPROPION HCL ER (SR) 150 MG PO TB12: 150.0000 mg | ORAL_TABLET | Freq: Two times a day (BID) | ORAL | 4 refills | Status: DC

## 2019-06-18 NOTE — Progress Notes (Signed)
Patient ID: Andrea Liu, female   DOB: 02-01-1976, 43 y.o.   MRN: [id number] History of Present Illness: Andrea Liu is a 43 year old white female, married, G2P2 in for a well woman gyn exam and pap. PCP is Delman Cheadle PA.    Current Medications, Allergies, Past Medical History, Past Surgical History, Family History and Social History were reviewed in [employer] record.     Review of Systems: Patient denies any headaches, hearing loss, fatigue, blurred vision, shortness of breath, chest pain, abdominal pain, problems with bowel movements, urination, or intercourse. No joint pain or mood swings. Has some hot flashes and night sweats and periods lighter.    Physical Exam:BP 125/75 (BP Location: Right Arm, Patient Position: Sitting, Cuff Size: Normal)   Pulse 79   Ht 5\' 5"  (1.651 m)   Wt 172 lb (78 kg)   LMP 06/05/2019   BMI 28.62 kg/m  General:  Well developed, well nourished, no acute distress Skin:  Warm and dry Neck:  Midline trachea, normal thyroid, good ROM, no lymphadenopathy Lungs; Clear to auscultation bilaterally Breast:  No dominant palpable mass, retraction, or nipple discharge Cardiovascular: Regular rate and rhythm Abdomen:  Soft, non tender, no hepatosplenomegaly Pelvic:  External genitalia is normal in appearance, no lesions.  The vagina is normal in appearance. Urethra has no lesions or masses. The cervix is bulbous.Pap with high risk HPV 16/18 genotyping performed.  Uterus is felt to be normal size, shape, and contour.  No adnexal masses or tenderness noted.Bladder is non tender, no masses felt. Rectal: Good sphincter tone, no polyps, or hemorrhoids felt.  Hemoccult negative. Extremities/musculoskeletal:  No swelling or varicosities noted, no clubbing or cyanosis Psych:  No mood changes, alert and cooperative,seems happy Fall risk is low PHQ 9 score is 4, is on Wellbutrin. Examination chaperoned by Diona Fanti CMA.  Impression and Plan: 1.  Encounter for gynecological examination with Papanicolaou smear of cervix Pap sent Physical in 1 year Pap in 3 years if normal Labs with PCP Mammogram yearly  2. Encounter for surveillance of contraceptive pills Meds ordered this encounter  Medications  . buPROPion (WELLBUTRIN SR) 150 MG 12 hr tablet    Sig: Take 1 tablet (150 mg total) by mouth 2 (two) times daily.    Dispense:  180 tablet    Refill:  4    Order Specific Question:   Supervising Provider    Answer:   Elonda Husky, LUTHER H [[id number]]  . norgestimate-ethinyl estradiol (SPRINTEC 28) 0.25-35 MG-MCG tablet    Sig: TAKE AS DIRECTED    Dispense:  84 tablet    Refill:  4    Order Specific Question:   Supervising Provider    Answer:   Elonda Husky, LUTHER H [[id number]]    3. Screening for colorectal cancer  4. Perimenopause

## 2019-06-24 LAB — CYTOLOGY - PAP
Comment: NEGATIVE
Diagnosis: NEGATIVE
High risk HPV: NEGATIVE

## 2019-08-21 ENCOUNTER — Other Ambulatory Visit: Payer: Self-pay | Admitting: Adult Health

## 2019-11-03 ENCOUNTER — Other Ambulatory Visit: Payer: Self-pay | Admitting: Adult Health

## 2019-12-24 ENCOUNTER — Other Ambulatory Visit: Payer: Self-pay | Admitting: *Deleted

## 2019-12-24 ENCOUNTER — Telehealth: Payer: Self-pay | Admitting: Adult Health

## 2019-12-24 DIAGNOSIS — N6489 Other specified disorders of breast: Secondary | ICD-10-CM

## 2019-12-24 NOTE — Telephone Encounter (Signed)
Telephoned patient and advised orders for mammogram were done. Patient voiced understanding.

## 2019-12-24 NOTE — Telephone Encounter (Signed)
Patient wants a referral put in for a diagnostic mammogram to follow up from the last one. She attempted to make the appointment but they were unable to do so until an order was put in.

## 2020-01-13 ENCOUNTER — Ambulatory Visit (HOSPITAL_COMMUNITY)
Admission: RE | Admit: 2020-01-13 | Discharge: 2020-01-13 | Disposition: A | Discharge status: Home / Self Care | Payer: BC Managed Care – PPO | Source: Ambulatory Visit | Attending: Adult Health | Admitting: Adult Health

## 2020-01-13 ENCOUNTER — Ambulatory Visit (HOSPITAL_COMMUNITY): Admission: RE | Admit: 2020-01-13 | Payer: BC Managed Care – PPO | Source: Ambulatory Visit

## 2020-01-13 ENCOUNTER — Other Ambulatory Visit: Payer: Self-pay

## 2020-01-13 DIAGNOSIS — N6489 Other specified disorders of breast: Secondary | ICD-10-CM | POA: Diagnosis not present

## 2020-01-13 DIAGNOSIS — R928 Other abnormal and inconclusive findings on diagnostic imaging of breast: Secondary | ICD-10-CM | POA: Diagnosis not present

## 2020-03-29 ENCOUNTER — Other Ambulatory Visit: Payer: Self-pay | Admitting: Adult Health

## 2020-04-22 ENCOUNTER — Other Ambulatory Visit: Payer: Self-pay | Admitting: Adult Health

## 2020-04-22 DIAGNOSIS — N6489 Other specified disorders of breast: Secondary | ICD-10-CM

## 2020-04-22 NOTE — Progress Notes (Signed)
Will place mammogram order for follow up left breat asymmetry

## 2020-06-01 ENCOUNTER — Ambulatory Visit (HOSPITAL_COMMUNITY)
Admission: RE | Admit: 2020-06-01 | Discharge: 2020-06-01 | Disposition: A | Discharge status: Home / Self Care | Payer: BC Managed Care – PPO | Source: Ambulatory Visit | Attending: Adult Health | Admitting: Adult Health

## 2020-06-01 ENCOUNTER — Other Ambulatory Visit: Payer: Self-pay

## 2020-06-01 DIAGNOSIS — N6489 Other specified disorders of breast: Secondary | ICD-10-CM

## 2020-06-11 ENCOUNTER — Other Ambulatory Visit: Payer: Self-pay | Admitting: Adult Health

## 2020-06-25 ENCOUNTER — Other Ambulatory Visit: Payer: Self-pay | Admitting: Adult Health

## 2020-07-02 ENCOUNTER — Other Ambulatory Visit: Payer: Self-pay | Admitting: Adult Health

## 2020-08-19 ENCOUNTER — Other Ambulatory Visit: Payer: Self-pay | Admitting: Adult Health

## 2020-09-28 ENCOUNTER — Other Ambulatory Visit: Payer: Self-pay | Admitting: Women's Health

## 2020-10-21 ENCOUNTER — Other Ambulatory Visit: Payer: Self-pay | Admitting: Adult Health

## 2020-11-01 ENCOUNTER — Other Ambulatory Visit: Payer: Self-pay | Admitting: Adult Health

## 2020-11-01 MED ORDER — CLOBETASOL PROPIONATE 0.05 % EX CREA: TOPICAL_CREAM | CUTANEOUS | 0 refills | Status: DC

## 2020-11-01 NOTE — Progress Notes (Signed)
Refilled temovate  

## 2020-12-14 ENCOUNTER — Other Ambulatory Visit: Payer: BC Managed Care – PPO | Admitting: Adult Health

## 2020-12-27 ENCOUNTER — Other Ambulatory Visit: Payer: Self-pay | Admitting: Adult Health

## 2021-01-26 ENCOUNTER — Other Ambulatory Visit: Payer: BC Managed Care – PPO | Admitting: Adult Health

## 2021-02-02 ENCOUNTER — Other Ambulatory Visit: Payer: Self-pay | Admitting: Adult Health

## 2021-02-02 MED ORDER — SULFAMETHOXAZOLE-TRIMETHOPRIM 800-160 MG PO TABS: 1.0000 | ORAL_TABLET | Freq: Two times a day (BID) | ORAL | 0 refills | Status: DC

## 2021-02-02 NOTE — Progress Notes (Signed)
Rx septra ds for UTI 

## 2021-02-10 ENCOUNTER — Ambulatory Visit
Admission: EM | Admit: 2021-02-10 | Discharge: 2021-02-10 | Disposition: A | Discharge status: Home / Self Care | Payer: BC Managed Care – PPO | Attending: Emergency Medicine | Admitting: Emergency Medicine

## 2021-02-10 ENCOUNTER — Encounter: Payer: Self-pay | Admitting: Emergency Medicine

## 2021-02-10 ENCOUNTER — Other Ambulatory Visit: Payer: Self-pay

## 2021-02-10 DIAGNOSIS — R300 Dysuria: Secondary | ICD-10-CM | POA: Diagnosis not present

## 2021-02-10 DIAGNOSIS — R1031 Right lower quadrant pain: Secondary | ICD-10-CM | POA: Insufficient documentation

## 2021-02-10 LAB — POCT URINALYSIS DIP (MANUAL ENTRY)
Bilirubin, UA: NEGATIVE
Glucose, UA: NEGATIVE mg/dL
Ketones, POC UA: NEGATIVE mg/dL
Nitrite, UA: NEGATIVE
Protein Ur, POC: NEGATIVE mg/dL
Spec Grav, UA: <=1.005 — AB (ref 1.010–1.025)
Urobilinogen, UA: 0.2 E.U./dL
pH, UA: 6 (ref 5.0–8.0)

## 2021-02-10 LAB — POCT URINE PREGNANCY: Preg Test, Ur: NEGATIVE

## 2021-02-10 NOTE — ED Triage Notes (Signed)
Patient c/o RT sided flank pain and urinary urgency that started today.   Patient denies N/V and vaginal discharge.   Patient endorses RT sided back pain.   Patient endorses "some burning" when peeing.   Patient was diagnosed with a UTI last week and prescribed an antibiotic (Septra), patient has finished these antibiotics, the symptoms resolved and then returned.

## 2021-02-10 NOTE — ED Provider Notes (Signed)
[name]    CSN: [id number] Arrival date & time: 02/10/21  1547      History   Chief Complaint Chief Complaint  Patient presents with   Flank Pain   Urinary Urgency     HPI Andrea Liu is a 45 y.o. female.  Patient presents with dysuria, urinary urgency, and RLQ abdominal pain since this morning.  She denies fever, chills, hematuria, vaginal discharge, pelvic pain, vomiting, diarrhea, constipation, or other symptoms.  Patient contacted her OB/GYN through [facility] and was treated with Septra DS for UTI on 02/02/2021.  The history is provided by the patient and medical records.   Past Medical History:  Diagnosis Date   Anxiety    Calcium disorder    Contraceptive management 05/02/2013   Depression    Sinus infection 05/05/2014   Thyroid disease     Patient Active Problem List   Diagnosis Date Noted   Encounter for gynecological examination with Papanicolaou smear of cervix 06/18/2019   Encounter for surveillance of contraceptive pills 06/18/2019   Screening for colorectal cancer 06/18/2019   Perimenopause 06/18/2019   Vitamin D deficiency 07/31/2017   Encounter for well woman exam with routine gynecological exam 05/22/2017   Hyperthyroidism 04/26/2017   Hypercalcemia 04/26/2017   Yeast infection 03/19/2017   Vaginal discharge 03/19/2017   Vaginal irritation 03/19/2017   Sinus infection 05/05/2014   Contraceptive management 05/02/2013   Anxiety 05/02/2013    Past Surgical History:  Procedure Laterality Date   CHOLECYSTECTOMY     EYE SURGERY  2007    OB History     Gravida  2   Para  2   Term  2   Preterm      AB      Living  2      SAB      IAB      Ectopic      Multiple      Live Births  2            Home Medications    Prior to Admission medications   Medication Sig Start Date End Date Taking? Authorizing Provider  buPROPion [facility] SR) 150 MG 12 hr tablet TAKE 1 TABLET BY MOUTH TWICE A DAY 06/25/20  Yes  Derrek Monaco A, NP  fluconazole (DIFLUCAN) 150 MG tablet TAKE 1 TABLET BY MOUTH NOW THEN REPEAT IN 3 DAYS IF NEEDED. THEN 1 TAB BEFORE MENSES AS NEEDED 12/27/20  Yes Estill Dooms, NP  norgestimate-ethinyl estradiol (ORTHO-CYCLEN) 0.25-35 MG-MCG tablet TAKE AS DIRECTED 07/06/20  Yes Derrek Monaco A, NP  acetaminophen (TYLENOL) 500 MG tablet Take 500 mg by mouth as needed.    [provider]  azithromycin (ZITHROMAX) 250 MG tablet Take 2 now and then 1 daily for 4 days Patient not taking: No sig reported 07/09/18   Derrek Monaco A, NP  Cholecalciferol (VITAMIN D) 2000 units CAPS Take by mouth.    [provider]  clobetasol cream (TEMOVATE) 0.05 % APPLY TO AFFECTED AREA TWICE A DAY FOR 2 WEEKS, THEN USE 2-3 TIMES A WEEK THEREAFTER 11/01/20   Derrek Monaco A, NP  Famotidine (PEPCID PO) Take by mouth once.     [provider]  ibuprofen (ADVIL,MOTRIN) 200 MG tablet Take 200 mg by mouth every 6 (six) hours as needed. For pain    [provider]  sulfamethoxazole-trimethoprim (BACTRIM DS) 800-160 MG tablet Take 1 tablet by mouth 2 (two) times daily. Take 1 bid 02/02/21   Laurann Montana,  Eduard Clos, NP    Family History Family History  Problem Relation Age of Onset   Diabetes Mother    Hypertension Mother    Hyperlipidemia Mother    Cancer Father        CML   Diabetes Maternal Grandmother    Heart disease Maternal Grandfather        pacemaker   Diabetes Brother    Hypertension Brother    Heart attack Paternal Grandfather    Seizures Son    Other Son        hypotonia, educational delay   Thyroid disease Neg Hx    Hypercalcemia Neg Hx     Social History Social History   Tobacco Use   Smoking status: Never   Smokeless tobacco: Never  Substance Use Topics   Alcohol use: No   Drug use: No     Allergies   Latex   Review of Systems Review of Systems  Constitutional:  Negative for chills and fever.  Respiratory:  Negative for  cough and shortness of breath.   Cardiovascular:  Negative for chest pain and palpitations.  Gastrointestinal:  Positive for abdominal pain. Negative for constipation, diarrhea, nausea and vomiting.  Genitourinary:  Positive for dysuria and urgency. Negative for flank pain, hematuria, pelvic pain and vaginal discharge.  Skin:  Negative for color change and rash.  All other systems reviewed and are negative.   Physical Exam Triage Vital Signs ED Triage Vitals  Enc Vitals Group     BP      Pulse      Resp      Temp      Temp src      SpO2      Weight      Height      Head Circumference      Peak Flow      Pain Score      Pain Loc      Pain Edu?      Excl. in [place]?    No data found.  Updated Vital Signs BP 133/88 (BP Location: Left Arm)   Pulse 67   Temp 98.9 F (37.2 C) (Oral)   Resp 16   LMP 02/07/2021   SpO2 98%   Visual Acuity Right Eye Distance:   Left Eye Distance:   Bilateral Distance:    Right Eye Near:   Left Eye Near:    Bilateral Near:     Physical Exam Vitals and nursing note reviewed.  Constitutional:      General: She is not in acute distress.    Appearance: She is well-developed. She is not ill-appearing.  HENT:     Head: Normocephalic and atraumatic.     Mouth/Throat:     Mouth: Mucous membranes are moist.  Eyes:     Conjunctiva/sclera: Conjunctivae normal.  Cardiovascular:     Rate and Rhythm: Normal rate and regular rhythm.     Heart sounds: Normal heart sounds.  Pulmonary:     Effort: Pulmonary effort is normal. No respiratory distress.     Breath sounds: Normal breath sounds.  Abdominal:     General: Bowel sounds are normal. There is no distension.     Palpations: Abdomen is soft.     Tenderness: There is abdominal tenderness in the right lower quadrant. There is no right CVA tenderness, left CVA tenderness, guarding or rebound.     Comments: Mild tenderness to palpation of RLQ. No rebound or guarding.   Musculoskeletal:  Cervical back: Neck supple.  Skin:    General: Skin is warm and dry.  Neurological:     General: No focal deficit present.     Mental Status: She is alert and oriented to person, place, and time.     Gait: Gait normal.  Psychiatric:        Mood and Affect: Mood normal.        Behavior: Behavior normal.     UC Treatments / Results  Labs (all labs ordered are listed, but only abnormal results are displayed) Labs Reviewed  POCT URINALYSIS DIP (MANUAL ENTRY) - Abnormal; Notable for the following components:      Result Value   Spec Grav, UA <=1.005 (*)    Blood, UA moderate (*)    Leukocytes, UA Small (1+) (*)    All other components within normal limits  URINE CULTURE  POCT URINE PREGNANCY    EKG   Radiology No results found.  Procedures Procedures (including critical care time)  Medications Ordered in UC Medications - No data to display  Initial Impression / Assessment and Plan / UC Course  I have reviewed the triage vital signs and the nursing notes.  Pertinent labs & imaging results that were available during my care of the patient were reviewed by me and considered in my medical decision making (see chart for details).  Right lower quadrant abdominal pain, dysuria.  Patient was treated with Septra DS on 02/02/2021 for a UTI by her OB/GYN; she was not seen in person for this.  I discussed the limitations of evaluation of abdominal pain in an urgent care setting and instructed the patient to go to the ED if she has acute abdominal pain.  Education on abdominal pain and dysuria provided.  Urine culture pending.  Urine pregnancy negative.  I discussed with patient that we will call her if the urine culture shows need for treatment.  Instructed her to follow-up with her PCP or OB/GYN as needed.  She agrees to plan of care.   Final Clinical Impressions(s) / UC Diagnoses   Final diagnoses:  Right lower quadrant abdominal pain  Dysuria     Discharge Instructions       Go to the emergency department if you have acute abdominal pain or other concerning symptoms.    The urine culture is pending.  We will call you if it shows the need for treatment.    Follow up with your primary care provider if your symptoms are not improving.          ED Prescriptions   None    PDMP not reviewed this encounter.   Sharion Balloon, NP 02/10/21 [phone]

## 2021-02-10 NOTE — Discharge Instructions (Addendum)
Go to the emergency department if you have acute abdominal pain or other concerning symptoms.    The urine culture is pending.  We will call you if it shows the need for treatment.    Follow up with your primary care provider if your symptoms are not improving.

## 2021-02-11 ENCOUNTER — Other Ambulatory Visit: Payer: BC Managed Care – PPO

## 2021-02-12 LAB — URINE CULTURE: Culture: NO GROWTH

## 2021-03-17 ENCOUNTER — Other Ambulatory Visit: Payer: BC Managed Care – PPO | Admitting: Adult Health

## 2021-04-16 ENCOUNTER — Other Ambulatory Visit: Payer: Self-pay | Admitting: Adult Health

## 2021-04-22 ENCOUNTER — Other Ambulatory Visit: Payer: Self-pay | Admitting: *Deleted

## 2021-04-22 DIAGNOSIS — N6489 Other specified disorders of breast: Secondary | ICD-10-CM

## 2021-04-28 ENCOUNTER — Other Ambulatory Visit (HOSPITAL_COMMUNITY): Payer: Self-pay | Admitting: Adult Health

## 2021-04-28 DIAGNOSIS — R928 Other abnormal and inconclusive findings on diagnostic imaging of breast: Secondary | ICD-10-CM

## 2021-05-13 ENCOUNTER — Encounter: Payer: Self-pay | Admitting: Adult Health

## 2021-05-13 ENCOUNTER — Ambulatory Visit (INDEPENDENT_AMBULATORY_CARE_PROVIDER_SITE_OTHER): Payer: BC Managed Care – PPO | Admitting: Adult Health

## 2021-05-13 ENCOUNTER — Other Ambulatory Visit: Payer: Self-pay

## 2021-05-13 VITALS — BP 127/84 | HR 70 | Ht 67.0 in | Wt 194.0 lb

## 2021-05-13 DIAGNOSIS — Z3041 Encounter for surveillance of contraceptive pills: Secondary | ICD-10-CM

## 2021-05-13 DIAGNOSIS — Z1211 Encounter for screening for malignant neoplasm of colon: Secondary | ICD-10-CM | POA: Diagnosis not present

## 2021-05-13 DIAGNOSIS — Z01419 Encounter for gynecological examination (general) (routine) without abnormal findings: Secondary | ICD-10-CM | POA: Diagnosis not present

## 2021-05-13 DIAGNOSIS — Z1212 Encounter for screening for malignant neoplasm of rectum: Secondary | ICD-10-CM

## 2021-05-13 DIAGNOSIS — N951 Menopausal and female climacteric states: Secondary | ICD-10-CM

## 2021-05-13 MED ORDER — NORGESTIMATE-ETH ESTRADIOL 0.25-35 MG-MCG PO TABS: 1.0000 | ORAL_TABLET | ORAL | 4 refills | Status: DC

## 2021-05-13 MED ORDER — BUPROPION HCL ER (SR) 150 MG PO TB12: 150.0000 mg | ORAL_TABLET | Freq: Two times a day (BID) | ORAL | 4 refills | Status: DC

## 2021-05-13 NOTE — Progress Notes (Signed)
Patient ID: Andrea Liu, female   DOB: 05-31-1976, 45 y.o.   MRN: [id number] History of Present Illness: [place] is a 45 year old white female,married, G2P2 with adopted son, in for a well woman gyn exam.  Lab Results  Component Value Date   DIAGPAP  06/18/2019    - Negative for intraepithelial lesion or malignancy (NILM)   [place] Negative 06/18/2019   PCP is Delman Cheadle PA.  Current Medications, Allergies, Past Medical History, Past Surgical History, Family History and Social History were reviewed in [employer] record.     Review of Systems: Patient denies any headaches, hearing loss, fatigue, blurred vision, shortness of breath, chest pain, problems with bowel movements, urination, or intercourse. No joint pain or mood swings.  She says periods changing, the color,texture and flow is different, occasional BTB and may feel tight the week after and is more gassy    Physical Exam:BP 127/84 (BP Location: Right Arm, Patient Position: Sitting, Cuff Size: Normal)   Pulse 70   Ht 5\' 7"  (1.702 m)   Wt 194 lb (88 kg)   LMP 05/09/2021 (Exact Date)   BMI 30.38 kg/m   General:  Well developed, well nourished, no acute distress Skin:  Warm and dry Neck:  Midline trachea, normal thyroid, good ROM, no lymphadenopathy Lungs; Clear to auscultation bilaterally Breast:  No dominant palpable mass, retraction, or nipple discharge Cardiovascular: Regular rate and rhythm Abdomen:  Soft, non tender, no hepatosplenomegaly Pelvic:  External genitalia is normal in appearance, no lesions.  The vagina is normal in appearance. Urethra has no lesions or masses. The cervix is bulbous.  Uterus is felt to be normal size, shape, and contour.  No adnexal masses or tenderness noted.Bladder is non tender, no masses felt. Rectal: Good sphincter tone, no polyps, or hemorrhoids felt.  Hemoccult negative. Extremities/musculoskeletal:  No swelling or varicosities noted, no clubbing or  cyanosis Psych:  No mood changes, alert and cooperative,seems happy AA is 0  Fall risk is low    Depression screen [facility] 2/9 05/13/2021 06/18/2019 05/22/2017  Decreased Interest 0 1 0  Down, Depressed, Hopeless 1 1 1   PHQ - 2 Score 1 2 1   Altered sleeping 0 1 -  Tired, decreased energy 0 0 -  Change in appetite 0 0 -  Feeling bad or failure about yourself  1 1 -  Trouble concentrating 0 0 -  Moving slowly or fidgety/restless 0 0 -  Suicidal thoughts 0 0 -  PHQ-9 Score 2 4 -  Difficult doing work/chores - Not difficult at all -    GAD 7 : Generalized Anxiety Score 05/13/2021  Nervous, Anxious, on Edge 0  Control/stop worrying 0  Worry too much - different things 0  Trouble relaxing 0  Restless 0  Easily annoyed or irritable 0  Afraid - awful might happen 0  Total GAD 7 Score 0    Upstream - 05/13/21 0839       Pregnancy Intention Screening   Does the patient want to become pregnant in the next year? No    Does the patient's partner want to become pregnant in the next year? No    Would the patient like to discuss contraceptive options today? No      Contraception Wrap Up   Current Method Oral Contraceptive    End Method Oral Contraceptive    Contraception Counseling Provided No            Examination chaperoned by [employer].  Impression and Plan: 1. Encounter for well woman exam with routine gynecological exam Pap and physical in 1 year Mammogram yearly Labs with PCP  2. Encounter for screening fecal occult blood testing   3. Screening for colorectal cancer Referred to [facility]  4. Encounter for surveillance of contraceptive pills Will OCs Meds ordered this encounter  Medications   norgestimate-ethinyl estradiol (ORTHO-CYCLEN) 0.25-35 MG-MCG tablet    Sig: Take 1 tablet by mouth See admin instructions.    Dispense:  84 tablet    Refill:  4    Order Specific Question:   Supervising Provider    Answer:   Tania Ade H [[id number]]   buPROPion (WELLBUTRIN SR) 150  MG 12 hr tablet    Sig: Take 1 tablet (150 mg total) by mouth 2 (two) times daily.    Dispense:  180 tablet    Refill:  4    Order Specific Question:   Supervising Provider    Answer:   Elonda Husky, LUTHER H [[id number]]     5. Perimenopause Will refill wellbutrin

## 2021-05-16 ENCOUNTER — Encounter: Payer: Self-pay | Admitting: Internal Medicine

## 2021-06-07 ENCOUNTER — Ambulatory Visit (HOSPITAL_COMMUNITY)
Admission: RE | Admit: 2021-06-07 | Discharge: 2021-06-07 | Disposition: A | Discharge status: Home / Self Care | Payer: BC Managed Care – PPO | Source: Ambulatory Visit | Attending: Adult Health | Admitting: Adult Health

## 2021-06-07 ENCOUNTER — Other Ambulatory Visit: Payer: Self-pay

## 2021-06-07 DIAGNOSIS — R928 Other abnormal and inconclusive findings on diagnostic imaging of breast: Secondary | ICD-10-CM

## 2021-06-07 DIAGNOSIS — N6489 Other specified disorders of breast: Secondary | ICD-10-CM | POA: Insufficient documentation

## 2021-06-07 DIAGNOSIS — R922 Inconclusive mammogram: Secondary | ICD-10-CM | POA: Diagnosis not present

## 2021-06-14 ENCOUNTER — Ambulatory Visit: Payer: BC Managed Care – PPO

## 2021-06-15 ENCOUNTER — Other Ambulatory Visit: Payer: Self-pay

## 2021-06-15 ENCOUNTER — Ambulatory Visit (INDEPENDENT_AMBULATORY_CARE_PROVIDER_SITE_OTHER): Payer: Self-pay | Admitting: *Deleted

## 2021-06-15 VITALS — Ht 66.0 in | Wt 185.0 lb

## 2021-06-15 DIAGNOSIS — Z1211 Encounter for screening for malignant neoplasm of colon: Secondary | ICD-10-CM

## 2021-06-15 MED ORDER — CLENPIQ 10-3.5-12 MG-GM -GM/160ML PO SOLN: 1.0000 | Freq: Once | ORAL | 0 refills | Status: AC

## 2021-06-15 NOTE — Progress Notes (Addendum)
Gastroenterology Pre-Procedure Review  Request Date: 06/15/2021 Requesting Physician: Derrek Monaco, NP @ [facility], no previous TCS, family hx of colon polyps (dad)  PATIENT REVIEW QUESTIONS: The patient responded to the following health history questions as indicated:    1. Diabetes Melitis: no 2. Joint replacements in the past 12 months: no 3. Major health problems in the past 3 months: no 4. Has an artificial valve or MVP: no 5. Has a defibrillator: no 6. Has been advised in past to take antibiotics in advance of a procedure like teeth cleaning: no 7. Family history of colon cancer: no, dad has hx of polyps 11 + years ago  60. Alcohol Use: no 9. Illicit drug Use: no 10. History of sleep apnea: no  11. History of coronary artery or other vascular stents placed within the last 12 months: no 12. History of any prior anesthesia complications: no 13. Body mass index is 29.86 kg/m.    MEDICATIONS & ALLERGIES:    Patient reports the following regarding taking any blood thinners:   Plavix? no Aspirin? no Coumadin? no Brilinta? no Xarelto? no Eliquis? no Pradaxa? no Savaysa? no Effient? no  Patient confirms/reports the following medications:  Current Outpatient Medications  Medication Sig Dispense Refill   acetaminophen (TYLENOL) 500 MG tablet Take 500 mg by mouth as needed.     buPROPion (WELLBUTRIN SR) 150 MG 12 hr tablet Take 1 tablet (150 mg total) by mouth 2 (two) times daily. 180 tablet 4   Cholecalciferol (VITAMIN D) 2000 units CAPS Take by mouth daily at 6 (six) AM.     fluconazole (DIFLUCAN) 150 MG tablet TAKE 1 TABLET BY MOUTH NOW THEN REPEAT IN 3 DAYS IF NEEDED. THEN 1 TAB BEFORE MENSES AS NEEDED (Patient taking differently: as needed. TAKE 1 TABLET BY MOUTH NOW THEN REPEAT IN 3 DAYS IF NEEDED. THEN 1 TAB BEFORE MENSES AS NEEDED) 2 tablet 2   ibuprofen (ADVIL,MOTRIN) 200 MG tablet Take 200 mg by mouth as needed. For pain     norgestimate-ethinyl estradiol  (ORTHO-CYCLEN) 0.25-35 MG-MCG tablet Take 1 tablet by mouth See admin instructions. 84 tablet 4   No current facility-administered medications for this visit.    Patient confirms/reports the following allergies:  Allergies  Allergen Reactions   Latex Rash    No orders of the defined types were placed in this encounter.   AUTHORIZATION INFORMATION Primary Insurance: [place],  [place] #: [id number],  Group #: [id number] Pre-Cert / Josem Kaufmann required: No, file to local [employer]BS  SCHEDULE INFORMATION: Procedure has been scheduled as follows:  Date: 07/29/2021, Time:  9:45 Location: [facility] with Dr. Abbey Chatters  This Gastroenterology Pre-Precedure Review Form is being routed to the following provider(s): Roseanne Kaufman, NP

## 2021-06-15 NOTE — Progress Notes (Signed)
Pt requested Jan procedure.  Will call her once available.  Roseanne Kaufman, NP: Pt wants to know if she can have Kombucha while on clear liquid diet.

## 2021-06-15 NOTE — Progress Notes (Addendum)
ASA 2. Appropriate. Would avoid anything other than what is written on list (no Kombucha).

## 2021-06-23 NOTE — Progress Notes (Signed)
Called pt to offer her procedure date before Jan 13.  Pt informed that I will call her back once rest of Jan procedure schedules have been finalized.  Pt voiced understanding.

## 2021-06-24 ENCOUNTER — Other Ambulatory Visit: Payer: Self-pay | Admitting: Adult Health

## 2021-06-27 NOTE — Progress Notes (Signed)
Spoke to pt.  Scheduled procedure for 07/29/2021 with arrival at 8:15 am.  Pt made aware that I am mailing out prep instructions.  Confirmed mailing address.  Pt made aware to avoid Kombucha while on clear liquid diet.  She voiced understanding.

## 2021-06-28 ENCOUNTER — Other Ambulatory Visit: Payer: Self-pay | Admitting: *Deleted

## 2021-06-28 ENCOUNTER — Encounter: Payer: Self-pay | Admitting: *Deleted

## 2021-06-28 DIAGNOSIS — Z1211 Encounter for screening for malignant neoplasm of colon: Secondary | ICD-10-CM

## 2021-06-28 NOTE — Progress Notes (Signed)
Pt made aware that she needs to have preg test done 2 days prior to procedure at [facility].  She voiced understanding.

## 2021-06-30 ENCOUNTER — Other Ambulatory Visit: Payer: Self-pay | Admitting: *Deleted

## 2021-06-30 NOTE — Progress Notes (Signed)
Tried to call [employer] and was on hold for 1 hour 51 minutes.  Had to hang up.  Faxed clinicals to [phone].

## 2021-06-30 NOTE — Progress Notes (Signed)
Informed pt that I faxed clinicals and inquired about whether there was an age criteria for colonoscopies.  Advised pt that she may want to call as well since I couldn't get them on phone.

## 2021-07-14 NOTE — Progress Notes (Signed)
Tried to follow up but was on hold for 10 minutes.  Had to hang up.

## 2021-07-15 NOTE — Progress Notes (Signed)
Faxed request to folow up on pending clinical review ([id number]) to [phone].

## 2021-07-27 ENCOUNTER — Other Ambulatory Visit (HOSPITAL_COMMUNITY)
Admission: RE | Admit: 2021-07-27 | Discharge: 2021-07-27 | Disposition: A | Discharge status: Home / Self Care | Payer: BC Managed Care – PPO | Source: Ambulatory Visit | Attending: Internal Medicine | Admitting: Internal Medicine

## 2021-07-27 DIAGNOSIS — D125 Benign neoplasm of sigmoid colon: Secondary | ICD-10-CM | POA: Diagnosis not present

## 2021-07-27 DIAGNOSIS — Z1211 Encounter for screening for malignant neoplasm of colon: Secondary | ICD-10-CM

## 2021-07-27 DIAGNOSIS — K648 Other hemorrhoids: Secondary | ICD-10-CM | POA: Diagnosis not present

## 2021-07-27 LAB — PREGNANCY, URINE: Preg Test, Ur: NEGATIVE

## 2021-07-29 ENCOUNTER — Ambulatory Visit (HOSPITAL_COMMUNITY): Payer: BC Managed Care – PPO | Admitting: Anesthesiology

## 2021-07-29 ENCOUNTER — Encounter (HOSPITAL_COMMUNITY): Payer: Self-pay

## 2021-07-29 ENCOUNTER — Encounter (HOSPITAL_COMMUNITY)
Admission: RE | Disposition: A | Discharge status: Home / Self Care | Payer: Self-pay | Source: Home / Self Care | Attending: Internal Medicine

## 2021-07-29 ENCOUNTER — Ambulatory Visit (HOSPITAL_COMMUNITY)
Admission: RE | Admit: 2021-07-29 | Discharge: 2021-07-29 | Disposition: A | Discharge status: Home / Self Care | Payer: BC Managed Care – PPO | Attending: Internal Medicine | Admitting: Internal Medicine

## 2021-07-29 ENCOUNTER — Other Ambulatory Visit: Payer: Self-pay

## 2021-07-29 DIAGNOSIS — K648 Other hemorrhoids: Secondary | ICD-10-CM | POA: Insufficient documentation

## 2021-07-29 DIAGNOSIS — Z1211 Encounter for screening for malignant neoplasm of colon: Secondary | ICD-10-CM

## 2021-07-29 DIAGNOSIS — D125 Benign neoplasm of sigmoid colon: Secondary | ICD-10-CM

## 2021-07-29 DIAGNOSIS — E0590 Thyrotoxicosis, unspecified without thyrotoxic crisis or storm: Secondary | ICD-10-CM | POA: Diagnosis not present

## 2021-07-29 DIAGNOSIS — K635 Polyp of colon: Secondary | ICD-10-CM | POA: Diagnosis not present

## 2021-07-29 DIAGNOSIS — Z139 Encounter for screening, unspecified: Secondary | ICD-10-CM | POA: Diagnosis not present

## 2021-07-29 HISTORY — PX: POLYPECTOMY: SHX5525

## 2021-07-29 HISTORY — PX: COLONOSCOPY WITH PROPOFOL: SHX5780

## 2021-07-29 SURGERY — COLONOSCOPY WITH PROPOFOL
Anesthesia: General

## 2021-07-29 MED ORDER — LACTATED RINGERS IV SOLN
INTRAVENOUS | Status: DC
  Administered 2021-07-29: 1000 mL via INTRAVENOUS

## 2021-07-29 MED ORDER — PROPOFOL 10 MG/ML IV BOLUS
INTRAVENOUS | Status: DC | PRN
  Administered 2021-07-29: 50 mg via INTRAVENOUS
  Administered 2021-07-29 (×2): 30 mg via INTRAVENOUS
  Administered 2021-07-29: 100 mg via INTRAVENOUS
  Administered 2021-07-29: 50 mg via INTRAVENOUS
  Administered 2021-07-29: 30 mg via INTRAVENOUS

## 2021-07-29 MED ORDER — LIDOCAINE HCL (CARDIAC) PF 100 MG/5ML IV SOSY
PREFILLED_SYRINGE | INTRAVENOUS | Status: DC | PRN
  Administered 2021-07-29: 50 mg via INTRAVENOUS

## 2021-07-29 NOTE — Transfer of Care (Signed)
Immediate Anesthesia Transfer of Care Note  Patient: Andrea Liu  Procedure(s) Performed: COLONOSCOPY WITH PROPOFOL POLYPECTOMY  Patient Location: Endoscopy Unit  Anesthesia Type:General  Level of Consciousness: awake  Airway & Oxygen Therapy: Patient Spontanous Breathing  Post-op Assessment: Report given to RN and Post -op Vital signs reviewed and stable  Post vital signs: Reviewed and stable  Last Vitals:  Vitals Value Taken Time  BP    Temp    Pulse    Resp    SpO2      Last Pain:  Vitals:   07/29/21 0925  TempSrc:   PainSc: 2       Patients Stated Pain Goal: 4 (76/19/50 9326)  Complications: No notable events documented.

## 2021-07-29 NOTE — Op Note (Signed)
[facility] Patient Name: Andrea Liu Procedure Date: 07/29/2021 9:25 AM MRN: [id number] Date of Birth: 04/24/1976 Attending MD: Elon Alas. Edgar Frisk CSN: [id number] Age: 46 Admit Type: Outpatient Procedure:                Colonoscopy Indications:              Screening for colorectal malignant neoplasm Providers:                Elon Alas. Abbey Chatters, DO, Janeece Riggers, RN, Hughie Closs, RN, Randa Spike, Technician Referring MD:              Medicines:                See the Anesthesia note for documentation of the                            administered medications Complications:            No immediate complications. Estimated Blood Loss:     Estimated blood loss was minimal. Procedure:                Pre-Anesthesia Assessment:                           - The anesthesia plan was to use monitored                            anesthesia care (MAC).                           After obtaining informed consent, the colonoscope                            was passed under direct vision. Throughout the                            procedure, the patient's blood pressure, pulse, and                            oxygen saturations were monitored continuously. The                            [id number]-HQ190L ([id number]) scope was introduced through                            the anus and advanced to the the cecum, identified                            by appendiceal orifice and ileocecal valve. The                            colonoscopy was performed without difficulty. The                            patient tolerated the  procedure well. The quality                            of the bowel preparation was evaluated using the                            BBPS [facility] Bowel Preparation Scale) with scores                            of: Right Colon = 3, Transverse Colon = 3 and Left                            Colon = 3 (entire mucosa seen well with no residual                             staining, small fragments of stool or opaque                            liquid). The total BBPS score equals 9. Scope In: 9:38:15 AM Scope Out: 9:52:04 AM Scope Withdrawal Time: 0 hours 9 minutes 56 seconds  Total Procedure Duration: 0 hours 13 minutes 49 seconds  Findings:      The perianal and digital rectal examinations were normal.      Non-bleeding internal hemorrhoids were found during endoscopy.      A 3 mm polyp was found in the sigmoid colon. The polyp was sessile. The       polyp was removed with a cold snare. Resection and retrieval were       complete.      The exam was otherwise without abnormality. Impression:               - Non-bleeding internal hemorrhoids.                           - One 3 mm polyp in the sigmoid colon, removed with                            a cold snare. Resected and retrieved.                           - The examination was otherwise normal. Moderate Sedation:      Per Anesthesia Care Recommendation:           - Patient has a contact number available for                            emergencies. The signs and symptoms of potential                            delayed complications were discussed with the                            patient. Return to normal activities tomorrow.  Written discharge instructions were provided to the                            patient.                           - Resume previous diet.                           - Continue present medications.                           - Await pathology results.                           - Repeat colonoscopy in 5-10 years for surveillance.                           - Return to GI clinic PRN. Procedure Code(s):        --- Professional ---                           [phone], Colonoscopy, flexible; with removal of                            tumor(s), polyp(s), or other lesion(s) by snare                            technique Diagnosis Code(s):        ---  Professional ---                           Z12.11, Encounter for screening for malignant                            neoplasm of colon                           K63.5, Polyp of colon                           K64.8, Other hemorrhoids CPT copyright 2019 American Medical Association. All rights reserved. The codes documented in this report are preliminary and upon coder review may  be revised to meet current compliance requirements. Elon Alas. Abbey Chatters, DO [place] Abbey Chatters, DO 07/29/2021 9:54:02 AM This report has been signed electronically. Number of Addenda: 0

## 2021-07-29 NOTE — Anesthesia Postprocedure Evaluation (Signed)
Anesthesia Post Note  Patient: Andrea Liu  Procedure(s) Performed: COLONOSCOPY WITH PROPOFOL POLYPECTOMY  Patient location during evaluation: Endoscopy Anesthesia Type: General Level of consciousness: awake and alert Pain management: pain level controlled Vital Signs Assessment: post-procedure vital signs reviewed and stable Respiratory status: spontaneous breathing, nonlabored ventilation, respiratory function stable and patient connected to nasal cannula oxygen Cardiovascular status: blood pressure returned to baseline and stable Postop Assessment: no apparent nausea or vomiting Anesthetic complications: no   No notable events documented.   Last Vitals:  Vitals:   07/29/21 0847 07/29/21 0954  BP: (!) 132/94 (!) 108/54  Pulse: 76   Resp: 16 (!) 21  Temp: 36.7 C 36.9 C  SpO2: 99% 99%    Last Pain:  Vitals:   07/29/21 0954  TempSrc: Oral  PainSc: 0-No pain                 Trixie Rude

## 2021-07-29 NOTE — Anesthesia Preprocedure Evaluation (Signed)
Anesthesia Evaluation  Patient identified by MRN, date of birth, ID band Patient awake    Reviewed: Allergy & Precautions, NPO status , Patient's Chart, lab work & pertinent test results  Airway Mallampati: II  TM Distance: >3 FB Neck ROM: Full    Dental no notable dental hx.    Pulmonary neg pulmonary ROS,    Pulmonary exam normal        Cardiovascular negative cardio ROS Normal cardiovascular exam     Neuro/Psych PSYCHIATRIC DISORDERS Anxiety Depression negative neurological ROS     GI/Hepatic negative GI ROS, Neg liver ROS,   Endo/Other  Hyperthyroidism   Renal/GU negative Renal ROS     Musculoskeletal negative musculoskeletal ROS (+)   Abdominal   Peds  Hematology negative hematology ROS (+)   Anesthesia Other Findings   Reproductive/Obstetrics                             Anesthesia Physical Anesthesia Plan  ASA: 2  Anesthesia Plan: General   Post-op Pain Management:    Induction: Intravenous  PONV Risk Score and Plan: 2 and TIVA  Airway Management Planned: Natural Airway and Nasal Cannula  Additional Equipment:   Intra-op Plan:   Post-operative Plan:   Informed Consent: I have reviewed the patients History and Physical, chart, labs and discussed the procedure including the risks, benefits and alternatives for the proposed anesthesia with the patient or authorized representative who has indicated his/her understanding and acceptance.       Plan Discussed with: CRNA  Anesthesia Plan Comments:         Anesthesia Quick Evaluation

## 2021-07-29 NOTE — H&P (Signed)
Primary Care Physician:  Scherrie Bateman Primary Gastroenterologist:  Dr. Abbey Chatters  Pre-Procedure History & Physical: HPI:  Andrea Liu is a 46 y.o. female is here for a colonoscopy for colon cancer screening purposes.  Patient denies any family history of colorectal cancer.  No melena or hematochezia.  No abdominal pain or unintentional weight loss.  No change in bowel habits.  Overall feels well from a GI standpoint.  Past Medical History:  Diagnosis Date   Anxiety    Calcium disorder    Contraceptive management 05/02/2013   Depression    Sinus infection 05/05/2014   Thyroid disease     Past Surgical History:  Procedure Laterality Date   CHOLECYSTECTOMY     EYE SURGERY  2007    Prior to Admission medications   Medication Sig Start Date End Date Taking? Authorizing Provider  acetaminophen (TYLENOL) 500 MG tablet Take 1,000 mg by mouth every 8 (eight) hours as needed (for pain.).   Yes [provider]  aspirin-acetaminophen-caffeine (EXCEDRIN MIGRAINE) [phone] MG tablet Take 1-2 tablets by mouth every 6 (six) hours as needed for headache.   Yes [provider]  buPROPion (WELLBUTRIN SR) 150 MG 12 hr tablet Take 1 tablet (150 mg total) by mouth 2 (two) times daily. 05/13/21  Yes Derrek Monaco A, NP  calcium carbonate (OS-CAL) 1250 (500 Ca) MG chewable tablet Chew 1-2 tablets by mouth 3 (three) times daily as needed for heartburn.   Yes [provider]  Cholecalciferol (VITAMIN D) 2000 units CAPS Take 2,000 Units by mouth every evening.   Yes [provider]  CLENPIQ 10-3.5-12 MG-GM -GM/160ML SOLN Take 320 mLs by mouth as directed. Before coloscopy 06/15/21  Yes [provider]  clobetasol cream (TEMOVATE) 8.56 % Apply 1 application topically 2 (two) times daily as needed (skin irritation).   Yes [provider]  fluconazole (DIFLUCAN) 150 MG tablet TAKE 1 TABLET BY MOUTH NOW THEN REPEAT IN 3 DAYS IF NEEDED. THEN 1  TAB BEFORE MENSES AS NEEDED 06/24/21  Yes Derrek Monaco A, NP  Melatonin 2.5 MG CHEW Chew 2.5 mg by mouth at bedtime as needed (sleep).   Yes [provider]  neomycin-bacitracin-polymyxin (NEOSPORIN) ointment Apply 1 application topically every 12 (twelve) hours.   Yes [provider]  norgestimate-ethinyl estradiol (ORTHO-CYCLEN) 0.25-35 MG-MCG tablet Take 1 tablet by mouth See admin instructions. Patient taking differently: Take 1 tablet by mouth every evening. 05/13/21  Yes Derrek Monaco A, NP  Pseudoephedrine HCl (SUDAFED PO) Take 1 tablet by mouth daily as needed (congestion).   Yes [provider]  simethicone (MYLICON) 80 MG chewable tablet Chew 80-160 mg by mouth 3 (three) times daily as needed for flatulence.   Yes [provider]  ibuprofen (ADVIL,MOTRIN) 200 MG tablet Take 400-800 mg by mouth every 8 (eight) hours as needed (headaches.).    [provider]    Allergies as of 06/28/2021 - Review Complete 06/15/2021  Allergen Reaction Noted   Latex Rash 05/02/2013    Family History  Problem Relation Age of Onset   Heart attack Paternal Grandfather    Diabetes Maternal Grandmother    Heart disease Maternal Grandfather        pacemaker   Cancer Father        CML   Diabetes Mother    Hypertension Mother    Hyperlipidemia Mother    Dementia Mother    Diabetes Brother    Hypertension Brother    Seizures Son  Other Son        hypotonia, educational delay   Thyroid disease Neg Hx    Hypercalcemia Neg Hx     Social History   Socioeconomic History   Marital status: Married    Spouse name: Not on file   Number of children: 2   Years of education: Not on file   Highest education level: Not on file  Occupational History   Not on file  Tobacco Use   Smoking status: Never   Smokeless tobacco: Never  Vaping Use   Vaping Use: Never used  Substance and Sexual Activity   Alcohol use: No   Drug use: No   Sexual  activity: Yes    Birth control/protection: Pill  Other Topics Concern   Not on file  Social History Narrative   Not on file   Social Determinants of Health   Financial Resource Strain: Low Risk    Difficulty of Paying Living Expenses: Not hard at all  Food Insecurity: No Food Insecurity   Worried About Charity fundraiser in the Last Year: Never true   [place] in the Last Year: Never true  Transportation Needs: No Transportation Needs   Lack of Transportation (Medical): No   Lack of Transportation (Non-Medical): No  Physical Activity: Sufficiently Active   Days of Exercise per Week: 5 days   Minutes of Exercise per Session: 30 min  Stress: No Stress Concern Present   Feeling of Stress : Not at all  Social Connections: Socially Integrated   Frequency of Communication with Friends and Family: More than three times a week   Frequency of Social Gatherings with Friends and Family: Once a week   Attends Religious Services: More than 4 times per year   Active Member of Clubs or Organizations: Yes   Attends Music therapist: More than 4 times per year   Marital Status: Married  Human resources officer Violence: Not At Risk   Fear of Current or Ex-Partner: No   Emotionally Abused: No   Physically Abused: No   Sexually Abused: No    Review of Systems: See HPI, otherwise negative ROS  Physical Exam: Vital signs in last 24 hours: Temp:  [98 F (36.7 C)] 98 F (36.7 C) (01/20 0847) Pulse Rate:  [76] 76 (01/20 0847) Resp:  [16] 16 (01/20 0847) BP: (132)/(94) 132/94 (01/20 0847) SpO2:  [99 %] 99 % (01/20 0847) Weight:  [86.2 kg] 86.2 kg (01/20 0847)   General:   Alert,  Well-developed, well-nourished, pleasant and cooperative in NAD Head:  Normocephalic and atraumatic. Eyes:  Sclera clear, no icterus.   Conjunctiva pink. Ears:  Normal auditory acuity. Nose:  No deformity, discharge,  or lesions. Mouth:  No deformity or lesions, dentition normal. Neck:  Supple;  no masses or thyromegaly. Lungs:  Clear throughout to auscultation.   No wheezes, crackles, or rhonchi. No acute distress. Heart:  Regular rate and rhythm; no murmurs, clicks, rubs,  or gallops. Abdomen:  Soft, nontender and nondistended. No masses, hepatosplenomegaly or hernias noted. Normal bowel sounds, without guarding, and without rebound.   Msk:  Symmetrical without gross deformities. Normal posture. Extremities:  Without clubbing or edema. Neurologic:  Alert and  oriented x4;  grossly normal neurologically. Skin:  Intact without significant lesions or rashes. Cervical Nodes:  No significant cervical adenopathy. Psych:  Alert and cooperative. Normal mood and affect.  Impression/Plan: Andrea Liu is here for a colonoscopy to be performed for colon cancer screening  purposes.  The risks of the procedure including infection, bleed, or perforation as well as benefits, limitations, alternatives and imponderables have been reviewed with the patient. Questions have been answered. All parties agreeable.

## 2021-07-29 NOTE — Discharge Instructions (Addendum)
°  Colonoscopy Discharge Instructions  Read the instructions outlined below and refer to this sheet in the next few weeks. These discharge instructions provide you with general information on caring for yourself after you leave the hospital. Your doctor may also give you specific instructions. While your treatment has been planned according to the most current medical practices available, unavoidable complications occasionally occur.   ACTIVITY You may resume your regular activity, but move at a slower pace for the next 24 hours.  Take frequent rest periods for the next 24 hours.  Walking will help get rid of the air and reduce the bloated feeling in your belly (abdomen).  No driving for 24 hours (because of the medicine (anesthesia) used during the test).   Do not sign any important legal documents or operate any machinery for 24 hours (because of the anesthesia used during the test).  NUTRITION Drink plenty of fluids.  You may resume your normal diet as instructed by your doctor.  Begin with a light meal and progress to your normal diet. Heavy or fried foods are harder to digest and may make you feel sick to your stomach (nauseated).  Avoid alcoholic beverages for 24 hours or as instructed.  MEDICATIONS You may resume your normal medications unless your doctor tells you otherwise.  WHAT YOU CAN EXPECT TODAY Some feelings of bloating in the abdomen.  Passage of more gas than usual.  Spotting of blood in your stool or on the toilet paper.  IF YOU HAD POLYPS REMOVED DURING THE COLONOSCOPY: No aspirin products for 7 days or as instructed.  No alcohol for 7 days or as instructed.  Eat a soft diet for the next 24 hours.  FINDING OUT THE RESULTS OF YOUR TEST Not all test results are available during your visit. If your test results are not back during the visit, make an appointment with your caregiver to find out the results. Do not assume everything is normal if you have not heard from your  caregiver or the medical facility. It is important for you to follow up on all of your test results.  SEEK IMMEDIATE MEDICAL ATTENTION IF: You have more than a spotting of blood in your stool.  Your belly is swollen (abdominal distention).  You are nauseated or vomiting.  You have a temperature over 101.  You have abdominal pain or discomfort that is severe or gets worse throughout the day.   Your colonoscopy revealed 1 polyp(s) which I removed successfully. Await pathology results, my office will contact you. I recommend repeating colonoscopy in 5-10 years for surveillance purposes depending on pathology. Otherwise follow up with GI as needed.     I hope you have a great rest of your week!  Elon Alas. Abbey Chatters, D.O. Gastroenterology and Hepatology [facility]

## 2021-08-01 ENCOUNTER — Encounter (HOSPITAL_COMMUNITY): Payer: Self-pay | Admitting: Internal Medicine

## 2021-08-01 LAB — SURGICAL PATHOLOGY

## 2021-08-15 ENCOUNTER — Other Ambulatory Visit: Payer: Self-pay | Admitting: Adult Health

## 2021-08-26 ENCOUNTER — Other Ambulatory Visit: Payer: Self-pay | Admitting: Adult Health

## 2021-11-28 ENCOUNTER — Telehealth: Payer: BC Managed Care – PPO | Admitting: Nurse Practitioner

## 2021-11-28 DIAGNOSIS — J40 Bronchitis, not specified as acute or chronic: Secondary | ICD-10-CM

## 2021-11-29 MED ORDER — BENZONATATE 100 MG PO CAPS
100.0000 mg | ORAL_CAPSULE | Freq: Three times a day (TID) | ORAL | 0 refills | Status: DC | PRN
Start: 2021-11-29 — End: 2021-12-19

## 2021-11-29 MED ORDER — ALBUTEROL SULFATE HFA 108 (90 BASE) MCG/ACT IN AERS: 2.0000 | INHALATION_SPRAY | Freq: Four times a day (QID) | RESPIRATORY_TRACT | 0 refills | Status: DC | PRN

## 2021-11-29 MED ORDER — PREDNISONE 10 MG (21) PO TBPK: ORAL_TABLET | ORAL | 0 refills | Status: DC

## 2021-11-29 NOTE — Progress Notes (Signed)
We are sorry that you are not feeling well.  Here is how we plan to help!  Based on your presentation I believe you most likely have A cough due to a virus.  This is called viral bronchitis and is best treated by rest, plenty of fluids and control of the cough.  You may use Ibuprofen or Tylenol as directed to help your symptoms.     In addition you may use A prescription cough medication called Tessalon Perles '100mg'$ . You may take 1-2 capsules every 8 hours as needed for your cough.  Prednisone 10 mg daily for 6 days (see taper instructions below)  Meds ordered this encounter  Medications   benzonatate (TESSALON) 100 MG capsule    Sig: Take 1 capsule (100 mg total) by mouth 3 (three) times daily as needed for cough.    Dispense:  20 capsule    Refill:  0   predniSONE (STERAPRED UNI-PAK 21 TAB) 10 MG (21) TBPK tablet    Sig: Take 6 tablets on day one, 5 on day two, 4 on day three, 3 on day four, 2 on day five, and 1 on day six. Take with food.    Dispense:  21 tablet    Refill:  0   albuterol (VENTOLIN HFA) 108 (90 Base) MCG/ACT inhaler    Sig: Inhale 2 puffs into the lungs every 6 (six) hours as needed for wheezing or shortness of breath.    Dispense:  8 g    Refill:  0     From your responses in the eVisit questionnaire you describe inflammation in the upper respiratory tract which is causing a significant cough.  This is commonly called Bronchitis and has four common causes:   Allergies Viral Infections Acid Reflux Bacterial Infection Allergies, viruses and acid reflux are treated by controlling symptoms or eliminating the cause. An example might be a cough caused by taking certain blood pressure medications. You stop the cough by changing the medication. Another example might be a cough caused by acid reflux. Controlling the reflux helps control the cough.  USE OF BRONCHODILATOR ("RESCUE") INHALERS: There is a risk from using your bronchodilator too frequently.  The risk is that  over-reliance on a medication which only relaxes the muscles surrounding the breathing tubes can reduce the effectiveness of medications prescribed to reduce swelling and congestion of the tubes themselves.  Although you feel brief relief from the bronchodilator inhaler, your asthma may actually be worsening with the tubes becoming more swollen and filled with mucus.  This can delay other crucial treatments, such as oral steroid medications. If you need to use a bronchodilator inhaler daily, several times per day, you should discuss this with your provider.  There are probably better treatments that could be used to keep your asthma under control.     HOME CARE Only take medications as instructed by your medical team. Complete the entire course of an antibiotic. Drink plenty of fluids and get plenty of rest. Avoid close contacts especially the very young and the elderly Cover your mouth if you cough or cough into your sleeve. Always remember to wash your hands A steam or ultrasonic humidifier can help congestion.   GET HELP RIGHT AWAY IF: You develop worsening fever. You become short of breath You cough up blood. Your symptoms persist after you have completed your treatment plan MAKE SURE YOU  Understand these instructions. Will watch your condition. Will get help right away if you are not doing well  or get worse.    Thank you for choosing an e-visit.  Your e-visit answers were reviewed by a board certified advanced clinical practitioner to complete your personal care plan. Depending upon the condition, your plan could have included both over the counter or prescription medications.  Please review your pharmacy choice. Make sure the pharmacy is open so you can pick up prescription now. If there is a problem, you may contact your provider through [employer] and have the prescription routed to another pharmacy.  Your safety is important to [place]. If you have drug allergies check your  prescription carefully.   For the next 24 hours you can use MyChart to ask questions about today's visit, request a non-urgent call back, or ask for a work or school excuse. You will get an email in the next two days asking about your experience. I hope that your e-visit has been valuable and will speed your recovery.   I spent approximately 7 minutes reviewing the patient's history, current symptoms and coordinating their plan of care today.

## 2021-12-19 ENCOUNTER — Telehealth: Payer: BC Managed Care – PPO | Admitting: Physician Assistant

## 2021-12-19 DIAGNOSIS — B9689 Other specified bacterial agents as the cause of diseases classified elsewhere: Secondary | ICD-10-CM | POA: Diagnosis not present

## 2021-12-19 DIAGNOSIS — J208 Acute bronchitis due to other specified organisms: Secondary | ICD-10-CM

## 2021-12-19 MED ORDER — AZITHROMYCIN 250 MG PO TABS: ORAL_TABLET | ORAL | 0 refills | Status: AC

## 2021-12-19 MED ORDER — BENZONATATE 100 MG PO CAPS: 100.0000 mg | ORAL_CAPSULE | Freq: Three times a day (TID) | ORAL | 0 refills | Status: DC | PRN

## 2021-12-19 NOTE — Progress Notes (Signed)

## 2021-12-28 ENCOUNTER — Telehealth: Payer: BC Managed Care – PPO | Admitting: Nurse Practitioner

## 2021-12-28 DIAGNOSIS — R053 Chronic cough: Secondary | ICD-10-CM

## 2021-12-28 NOTE — Progress Notes (Signed)
Because this has been going on for so long, I feel your condition warrants further evaluation and I recommend that you be seen in a face to face visit. Reviewing your chart we have already attempted inhalers, steroids and antibiotics. At this point a chest Xray is likely needed to assure your diagnosis.    NOTE: There will be NO CHARGE for this eVisit   If you are having a true medical emergency please call 911.      For an urgent face to face visit, [place] has seven urgent care centers for your convenience:     [place] [facility] [place] [facility] [place] Get Driving Directions [phone] [place], [place] [address]    [place] [facility] [place]) Get Driving Directions [phone] [place], [place] [address]  [place] [facility] [place] ([place]) Get Driving Directions [phone] [address] [place],  [place]  [address]  [place] [facility] [place] [facility] - at [address] Get Driving Directions  [phone] [address] [place],  [place] [address]   [place] [facility] Get Driving Directions [phone] [place], [place], [place] [address]   [place] [facility] [place] Get Driving Directions  [phone] [address] [place], [place] [address]   [place] [facility] [place] Get Driving Directions [phone] [address] [place], [place] [address]  Your MyChart E-visit questionnaire answers were reviewed by a board certified advanced clinical practitioner to complete your personal care plan based on your specific symptoms.  Thank you for using e-Visits.

## 2022-01-23 ENCOUNTER — Other Ambulatory Visit: Payer: Self-pay | Admitting: Adult Health

## 2022-01-25 ENCOUNTER — Other Ambulatory Visit: Payer: Self-pay | Admitting: Adult Health

## 2022-01-25 MED ORDER — CEPHALEXIN 500 MG PO CAPS: 500.0000 mg | ORAL_CAPSULE | Freq: Four times a day (QID) | ORAL | 0 refills | Status: DC

## 2022-01-25 NOTE — Progress Notes (Signed)
Rx keflex 

## 2022-03-01 ENCOUNTER — Telehealth: Payer: BC Managed Care – PPO | Admitting: Physician Assistant

## 2022-03-01 DIAGNOSIS — J019 Acute sinusitis, unspecified: Secondary | ICD-10-CM | POA: Diagnosis not present

## 2022-03-01 DIAGNOSIS — B9689 Other specified bacterial agents as the cause of diseases classified elsewhere: Secondary | ICD-10-CM | POA: Diagnosis not present

## 2022-03-01 MED ORDER — DOXYCYCLINE HYCLATE 100 MG PO TABS: 100.0000 mg | ORAL_TABLET | Freq: Two times a day (BID) | ORAL | 0 refills | Status: DC

## 2022-03-01 NOTE — Progress Notes (Signed)

## 2022-03-17 ENCOUNTER — Telehealth: Payer: BC Managed Care – PPO | Admitting: Physician Assistant

## 2022-03-17 DIAGNOSIS — J329 Chronic sinusitis, unspecified: Secondary | ICD-10-CM

## 2022-03-17 DIAGNOSIS — J0100 Acute maxillary sinusitis, unspecified: Secondary | ICD-10-CM | POA: Diagnosis not present

## 2022-03-17 NOTE — Progress Notes (Signed)
Because you have failed first-lie treatments, I feel your condition warrants further evaluation and I recommend that you be seen in a face to face visit.  NOTE: There will be NO CHARGE for this eVisit   If you are having a true medical emergency please call 911.      For an urgent face to face visit, [facility] has seven urgent care centers for your convenience:     [place] [facility] [place] [facility] [place] Get Driving Directions [phone] [place], [place] [address]    [place] [facility] [place] [facility]) Get Driving Directions [phone] [address], [place] [address]  [place] [facility] [place] ([place]) Get Driving Directions [phone] [address] [place],  [place]  [address]  [place] [facility] [place] [facility] - at [address] [place] Get Driving Directions  [phone] [address] [place],  [place] [address]   [place] [facility] Get Driving Directions [phone] [place], [place], [place] [address]   [place] [facility] Get Driving Directions  [phone] [address] [place], [place] [address]   [place] [facility] [place] Get Driving Directions [phone] [address], [place], [place] [address]  Your MyChart E-visit questionnaire answers were reviewed by a board certified advanced clinical practitioner to complete your personal care plan based on your specific symptoms.  Thank you for using e-Visits.   I provided 5 minutes of non face-to-face time during this encounter for chart review and documentation.

## 2022-03-24 ENCOUNTER — Other Ambulatory Visit: Payer: Self-pay | Admitting: Adult Health

## 2022-03-30 DIAGNOSIS — R5383 Other fatigue: Secondary | ICD-10-CM | POA: Diagnosis not present

## 2022-03-30 DIAGNOSIS — E6609 Other obesity due to excess calories: Secondary | ICD-10-CM | POA: Diagnosis not present

## 2022-03-30 DIAGNOSIS — N951 Menopausal and female climacteric states: Secondary | ICD-10-CM | POA: Diagnosis not present

## 2022-03-30 DIAGNOSIS — Z6833 Body mass index (BMI) 33.0-33.9, adult: Secondary | ICD-10-CM | POA: Diagnosis not present

## 2022-03-30 DIAGNOSIS — G9332 Myalgic encephalomyelitis/chronic fatigue syndrome: Secondary | ICD-10-CM | POA: Diagnosis not present

## 2022-04-18 ENCOUNTER — Other Ambulatory Visit (HOSPITAL_COMMUNITY): Payer: Self-pay | Admitting: Adult Health

## 2022-04-18 DIAGNOSIS — Z1231 Encounter for screening mammogram for malignant neoplasm of breast: Secondary | ICD-10-CM

## 2022-05-20 ENCOUNTER — Other Ambulatory Visit: Payer: Self-pay | Admitting: Adult Health

## 2022-05-31 ENCOUNTER — Ambulatory Visit: Payer: BC Managed Care – PPO | Admitting: Adult Health

## 2022-06-09 ENCOUNTER — Ambulatory Visit (HOSPITAL_COMMUNITY)
Admission: RE | Admit: 2022-06-09 | Discharge: 2022-06-09 | Disposition: A | Discharge status: Home / Self Care | Payer: BC Managed Care – PPO | Source: Ambulatory Visit | Attending: Adult Health | Admitting: Adult Health

## 2022-06-09 DIAGNOSIS — Z1231 Encounter for screening mammogram for malignant neoplasm of breast: Secondary | ICD-10-CM

## 2022-06-26 ENCOUNTER — Telehealth: Payer: BC Managed Care – PPO | Admitting: Physician Assistant

## 2022-06-26 DIAGNOSIS — J019 Acute sinusitis, unspecified: Secondary | ICD-10-CM

## 2022-06-26 DIAGNOSIS — B9689 Other specified bacterial agents as the cause of diseases classified elsewhere: Secondary | ICD-10-CM

## 2022-06-26 MED ORDER — AMOXICILLIN-POT CLAVULANATE 875-125 MG PO TABS: 1.0000 | ORAL_TABLET | Freq: Two times a day (BID) | ORAL | 0 refills | Status: DC

## 2022-06-26 NOTE — Progress Notes (Signed)

## 2022-07-06 DIAGNOSIS — Z1331 Encounter for screening for depression: Secondary | ICD-10-CM | POA: Diagnosis not present

## 2022-07-06 DIAGNOSIS — N951 Menopausal and female climacteric states: Secondary | ICD-10-CM | POA: Diagnosis not present

## 2022-07-06 DIAGNOSIS — E6609 Other obesity due to excess calories: Secondary | ICD-10-CM | POA: Diagnosis not present

## 2022-07-06 DIAGNOSIS — Z6834 Body mass index (BMI) 34.0-34.9, adult: Secondary | ICD-10-CM | POA: Diagnosis not present

## 2022-07-06 DIAGNOSIS — Z0001 Encounter for general adult medical examination with abnormal findings: Secondary | ICD-10-CM | POA: Diagnosis not present

## 2022-07-06 DIAGNOSIS — R5383 Other fatigue: Secondary | ICD-10-CM | POA: Diagnosis not present

## 2022-07-18 ENCOUNTER — Ambulatory Visit: Payer: BC Managed Care – PPO | Admitting: Adult Health

## 2022-07-27 DIAGNOSIS — H5203 Hypermetropia, bilateral: Secondary | ICD-10-CM | POA: Diagnosis not present

## 2022-07-27 DIAGNOSIS — H5000 Unspecified esotropia: Secondary | ICD-10-CM | POA: Diagnosis not present

## 2022-08-29 ENCOUNTER — Encounter: Payer: Self-pay | Admitting: Adult Health

## 2022-08-29 ENCOUNTER — Ambulatory Visit (INDEPENDENT_AMBULATORY_CARE_PROVIDER_SITE_OTHER): Payer: BC Managed Care – PPO | Admitting: Adult Health

## 2022-08-29 ENCOUNTER — Other Ambulatory Visit (HOSPITAL_COMMUNITY)
Admission: RE | Admit: 2022-08-29 | Discharge: 2022-08-29 | Disposition: A | Discharge status: Home / Self Care | Payer: BC Managed Care – PPO | Source: Ambulatory Visit | Attending: Adult Health | Admitting: Adult Health

## 2022-08-29 VITALS — BP 172/87 | HR 71 | Ht 67.0 in | Wt 215.0 lb

## 2022-08-29 DIAGNOSIS — Z01419 Encounter for gynecological examination (general) (routine) without abnormal findings: Secondary | ICD-10-CM

## 2022-08-29 DIAGNOSIS — F439 Reaction to severe stress, unspecified: Secondary | ICD-10-CM

## 2022-08-29 DIAGNOSIS — Z3041 Encounter for surveillance of contraceptive pills: Secondary | ICD-10-CM

## 2022-08-29 DIAGNOSIS — Z1211 Encounter for screening for malignant neoplasm of colon: Secondary | ICD-10-CM | POA: Diagnosis not present

## 2022-08-29 DIAGNOSIS — R030 Elevated blood-pressure reading, without diagnosis of hypertension: Secondary | ICD-10-CM | POA: Diagnosis not present

## 2022-08-29 LAB — HEMOCCULT GUIAC POC 1CARD (OFFICE): Fecal Occult Blood, POC: NEGATIVE

## 2022-08-29 MED ORDER — FLUCONAZOLE 150 MG PO TABS: ORAL_TABLET | ORAL | 3 refills | Status: DC
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
## 2022-09-01 LAB — CYTOLOGY - PAP
Comment: NEGATIVE
Diagnosis: NEGATIVE
High risk HPV: NEGATIVE

## 2022-10-04 ENCOUNTER — Telehealth: Payer: BC Managed Care – PPO | Admitting: Nurse Practitioner

## 2022-10-04 DIAGNOSIS — J0110 Acute frontal sinusitis, unspecified: Secondary | ICD-10-CM

## 2022-10-04 DIAGNOSIS — J329 Chronic sinusitis, unspecified: Secondary | ICD-10-CM

## 2022-10-04 DIAGNOSIS — B9789 Other viral agents as the cause of diseases classified elsewhere: Secondary | ICD-10-CM | POA: Diagnosis not present

## 2022-10-04 MED ORDER — AMOXICILLIN-POT CLAVULANATE 875-125 MG PO TABS: 1.0000 | ORAL_TABLET | Freq: Two times a day (BID) | ORAL | 0 refills | Status: AC

## 2022-10-04 MED ORDER — IPRATROPIUM BROMIDE 0.03 % NA SOLN: 2.0000 | Freq: Two times a day (BID) | NASAL | 12 refills | Status: DC

## 2022-10-04 NOTE — Progress Notes (Signed)
E-Visit for Sinus Problems  We are sorry that you are not feeling well.  Here is how we plan to help!  Based on what you have shared with me it looks like you have sinusitis.  Sinusitis is inflammation and infection in the sinus cavities of the head.  Based on your presentation I believe you most likely have Acute Viral Sinusitis.This is an infection most likely caused by a virus. There is not specific treatment for viral sinusitis other than to help you with the symptoms until the infection runs its course.  You may use an oral decongestant such as Mucinex D or if you have glaucoma or high blood pressure use plain Mucinex. Saline nasal spray help and can safely be used as often as needed for congestion, I have prescribed: Ipratropium Bromide nasal spray 0.03% 2 sprays in eah nostril 2-3 times a day  It typically takes 7-10 days of consistent nasal congestion prior to the onset of a bacterial sinusitis requiring antibiotics   Providers prescribe antibiotics to treat infections caused by bacteria. Antibiotics are very powerful in treating bacterial infections when they are used properly. To maintain their effectiveness, they should be used only when necessary. Overuse of antibiotics has resulted in the development of superbugs that are resistant to treatment!    After careful review of your answers, I would not recommend an antibiotic for your condition.  Antibiotics are not effective against viruses and therefore should not be used to treat them. Common examples of infections caused by viruses include colds and flu   Some authorities believe that zinc sprays or the use of Echinacea may shorten the course of your symptoms.  Sinus infections are not as easily transmitted as other respiratory infection, however we still recommend that you avoid close contact with loved ones, especially the very young and elderly.  Remember to wash your hands thoroughly throughout the day as this is the number one way to  prevent the spread of infection!  Home Care: Only take medications as instructed by your medical team. Do not take these medications with alcohol. A steam or ultrasonic humidifier can help congestion.  You can place a towel over your head and breathe in the steam from hot water coming from a faucet. Avoid close contacts especially the very young and the elderly. Cover your mouth when you cough or sneeze. Always remember to wash your hands.  Get Help Right Away If: You develop worsening fever or sinus pain. You develop a severe head ache or visual changes. Your symptoms persist after you have completed your treatment plan.  Make sure you Understand these instructions. Will watch your condition. Will get help right away if you are not doing well or get worse.   Thank you for choosing an e-visit.  Your e-visit answers were reviewed by a board certified advanced clinical practitioner to complete your personal care plan. Depending upon the condition, your plan could have included both over the counter or prescription medications.  Please review your pharmacy choice. Make sure the pharmacy is open so you can pick up prescription now. If there is a problem, you may contact your provider through [employer] and have the prescription routed to another pharmacy.  Your safety is important to [place]. If you have drug allergies check your prescription carefully.   For the next 24 hours you can use MyChart to ask questions about today's visit, request a non-urgent call back, or ask for a work or school excuse. You will get  an email in the next two days asking about your experience. I hope that your e-visit has been valuable and will speed your recovery.    Meds ordered this encounter  Medications   ipratropium (ATROVENT) 0.03 % nasal spray    Sig: Place 2 sprays into both nostrils every 12 (twelve) hours.    Dispense:  30 mL    Refill:  12    I spent approximately 5 minutes reviewing the  patient's history, current symptoms and coordinating their care today.

## 2022-10-04 NOTE — Addendum Note (Signed)
Addended by: Madilyn Hook on: 10/04/2022 04:33 PM   Modules accepted: Orders

## 2022-11-28 DIAGNOSIS — Z0001 Encounter for general adult medical examination with abnormal findings: Secondary | ICD-10-CM | POA: Diagnosis not present

## 2022-11-28 DIAGNOSIS — N951 Menopausal and female climacteric states: Secondary | ICD-10-CM | POA: Diagnosis not present

## 2022-11-29 DIAGNOSIS — Z6834 Body mass index (BMI) 34.0-34.9, adult: Secondary | ICD-10-CM | POA: Diagnosis not present

## 2022-11-29 DIAGNOSIS — E6609 Other obesity due to excess calories: Secondary | ICD-10-CM | POA: Diagnosis not present

## 2022-11-29 DIAGNOSIS — N951 Menopausal and female climacteric states: Secondary | ICD-10-CM | POA: Diagnosis not present

## 2022-12-07 DIAGNOSIS — I10 Essential (primary) hypertension: Secondary | ICD-10-CM | POA: Diagnosis not present

## 2022-12-26 ENCOUNTER — Other Ambulatory Visit: Payer: Self-pay | Admitting: Adult Health

## 2023-02-06 ENCOUNTER — Other Ambulatory Visit: Payer: Self-pay | Admitting: Adult Health

## 2023-02-07 ENCOUNTER — Other Ambulatory Visit (HOSPITAL_COMMUNITY): Payer: Self-pay

## 2023-02-08 ENCOUNTER — Other Ambulatory Visit (HOSPITAL_COMMUNITY): Payer: Self-pay

## 2023-02-08 MED ORDER — SEMAGLUTIDE-WEIGHT MANAGEMENT 0.25 MG/0.5ML ~~LOC~~ SOAJ
0.2500 mg | SUBCUTANEOUS | 2 refills | Status: DC
  Filled 2023-02-08 (×3): qty 2, 28d supply, fill #0

## 2023-03-02 ENCOUNTER — Other Ambulatory Visit (HOSPITAL_COMMUNITY): Payer: Self-pay

## 2023-03-02 DIAGNOSIS — Z6833 Body mass index (BMI) 33.0-33.9, adult: Secondary | ICD-10-CM | POA: Diagnosis not present

## 2023-03-02 DIAGNOSIS — I10 Essential (primary) hypertension: Secondary | ICD-10-CM | POA: Diagnosis not present

## 2023-03-02 DIAGNOSIS — N951 Menopausal and female climacteric states: Secondary | ICD-10-CM | POA: Diagnosis not present

## 2023-03-02 DIAGNOSIS — E6609 Other obesity due to excess calories: Secondary | ICD-10-CM | POA: Diagnosis not present

## 2023-03-02 MED ORDER — WEGOVY 0.5 MG/0.5ML ~~LOC~~ SOAJ
0.5000 mg | SUBCUTANEOUS | 2 refills | Status: DC
  Filled 2023-03-02: qty 2, 28d supply, fill #0
  Filled 2023-03-19 – 2023-03-27 (×4): qty 2, 28d supply, fill #1

## 2023-03-19 ENCOUNTER — Other Ambulatory Visit (HOSPITAL_COMMUNITY): Payer: Self-pay

## 2023-03-20 ENCOUNTER — Other Ambulatory Visit (HOSPITAL_COMMUNITY): Payer: Self-pay

## 2023-03-21 ENCOUNTER — Other Ambulatory Visit (HOSPITAL_COMMUNITY): Payer: Self-pay

## 2023-03-23 ENCOUNTER — Other Ambulatory Visit (HOSPITAL_COMMUNITY): Payer: Self-pay

## 2023-03-23 ENCOUNTER — Other Ambulatory Visit (HOSPITAL_BASED_OUTPATIENT_CLINIC_OR_DEPARTMENT_OTHER): Payer: Self-pay

## 2023-03-23 ENCOUNTER — Other Ambulatory Visit: Payer: Self-pay

## 2023-03-26 ENCOUNTER — Other Ambulatory Visit: Payer: Self-pay

## 2023-03-26 ENCOUNTER — Other Ambulatory Visit (HOSPITAL_COMMUNITY): Payer: Self-pay

## 2023-03-27 ENCOUNTER — Other Ambulatory Visit (HOSPITAL_COMMUNITY): Payer: Self-pay

## 2023-04-03 ENCOUNTER — Other Ambulatory Visit (HOSPITAL_COMMUNITY): Payer: Self-pay

## 2023-04-11 ENCOUNTER — Other Ambulatory Visit (HOSPITAL_COMMUNITY): Payer: Self-pay

## 2023-04-17 ENCOUNTER — Other Ambulatory Visit (HOSPITAL_COMMUNITY): Payer: Self-pay

## 2023-04-17 MED ORDER — WEGOVY 1 MG/0.5ML ~~LOC~~ SOAJ
1.0000 mg | SUBCUTANEOUS | 5 refills | Status: DC
  Filled 2023-04-17 – 2023-04-24 (×4): qty 2, 28d supply, fill #0
  Filled 2023-05-17: qty 2, 28d supply, fill #1
  Filled 2023-06-08 – 2023-06-19 (×4): qty 2, 28d supply, fill #2

## 2023-04-23 ENCOUNTER — Other Ambulatory Visit (HOSPITAL_COMMUNITY): Payer: Self-pay

## 2023-04-24 ENCOUNTER — Other Ambulatory Visit (HOSPITAL_COMMUNITY): Payer: Self-pay

## 2023-05-06 ENCOUNTER — Other Ambulatory Visit: Payer: Self-pay | Admitting: Adult Health

## 2023-05-10 ENCOUNTER — Other Ambulatory Visit (HOSPITAL_COMMUNITY): Payer: Self-pay | Admitting: Adult Health

## 2023-05-10 DIAGNOSIS — Z1231 Encounter for screening mammogram for malignant neoplasm of breast: Secondary | ICD-10-CM

## 2023-05-17 ENCOUNTER — Other Ambulatory Visit (HOSPITAL_COMMUNITY): Payer: Self-pay

## 2023-06-01 ENCOUNTER — Other Ambulatory Visit (HOSPITAL_COMMUNITY): Payer: Self-pay

## 2023-06-01 MED ORDER — WEGOVY 1.7 MG/0.75ML ~~LOC~~ SOAJ
1.7000 mg | SUBCUTANEOUS | 5 refills | Status: DC
  Filled 2023-06-01 – 2023-06-22 (×6): qty 3, 28d supply, fill #0
  Filled 2023-07-19: qty 3, 28d supply, fill #1

## 2023-06-04 ENCOUNTER — Other Ambulatory Visit (HOSPITAL_COMMUNITY): Payer: Self-pay

## 2023-06-08 ENCOUNTER — Other Ambulatory Visit (HOSPITAL_COMMUNITY): Payer: Self-pay

## 2023-06-13 ENCOUNTER — Encounter (HOSPITAL_COMMUNITY): Payer: Self-pay

## 2023-06-13 ENCOUNTER — Ambulatory Visit (HOSPITAL_COMMUNITY)
Admission: RE | Admit: 2023-06-13 | Discharge: 2023-06-13 | Disposition: A | Discharge status: Home / Self Care | Payer: BC Managed Care – PPO | Source: Ambulatory Visit | Attending: Adult Health | Admitting: Adult Health

## 2023-06-13 DIAGNOSIS — Z1231 Encounter for screening mammogram for malignant neoplasm of breast: Secondary | ICD-10-CM | POA: Diagnosis not present

## 2023-06-14 ENCOUNTER — Other Ambulatory Visit (HOSPITAL_COMMUNITY): Payer: Self-pay

## 2023-06-18 ENCOUNTER — Other Ambulatory Visit (HOSPITAL_COMMUNITY): Payer: Self-pay

## 2023-06-19 ENCOUNTER — Other Ambulatory Visit (HOSPITAL_COMMUNITY): Payer: Self-pay

## 2023-06-20 ENCOUNTER — Other Ambulatory Visit (HOSPITAL_COMMUNITY): Payer: Self-pay

## 2023-06-21 DIAGNOSIS — E6609 Other obesity due to excess calories: Secondary | ICD-10-CM | POA: Diagnosis not present

## 2023-06-21 DIAGNOSIS — I10 Essential (primary) hypertension: Secondary | ICD-10-CM | POA: Diagnosis not present

## 2023-06-22 ENCOUNTER — Other Ambulatory Visit (HOSPITAL_COMMUNITY): Payer: Self-pay

## 2023-06-25 ENCOUNTER — Other Ambulatory Visit (HOSPITAL_COMMUNITY): Payer: Self-pay

## 2023-06-28 ENCOUNTER — Other Ambulatory Visit: Payer: Self-pay | Admitting: Adult Health

## 2023-07-04 ENCOUNTER — Other Ambulatory Visit: Payer: Self-pay | Admitting: Adult Health

## 2023-07-04 ENCOUNTER — Telehealth: Payer: BC Managed Care – PPO | Admitting: Family Medicine

## 2023-07-04 DIAGNOSIS — J019 Acute sinusitis, unspecified: Secondary | ICD-10-CM | POA: Diagnosis not present

## 2023-07-04 MED ORDER — AMOXICILLIN-POT CLAVULANATE 875-125 MG PO TABS: 1.0000 | ORAL_TABLET | Freq: Two times a day (BID) | ORAL | 0 refills | Status: AC

## 2023-07-04 NOTE — Progress Notes (Signed)

## 2023-07-08 ENCOUNTER — Other Ambulatory Visit: Payer: Self-pay | Admitting: Adult Health

## 2023-07-14 ENCOUNTER — Other Ambulatory Visit: Payer: Self-pay | Admitting: Adult Health

## 2023-07-27 ENCOUNTER — Other Ambulatory Visit (HOSPITAL_COMMUNITY): Payer: Self-pay

## 2023-07-27 MED ORDER — WEGOVY 2.4 MG/0.75ML ~~LOC~~ SOAJ
2.4000 mg | SUBCUTANEOUS | 5 refills | Status: DC
  Filled 2023-07-27: qty 3, 28d supply, fill #0

## 2023-08-09 DIAGNOSIS — G44201 Tension-type headache, unspecified, intractable: Secondary | ICD-10-CM | POA: Diagnosis not present

## 2023-08-09 DIAGNOSIS — M5412 Radiculopathy, cervical region: Secondary | ICD-10-CM | POA: Diagnosis not present

## 2023-08-09 DIAGNOSIS — M9901 Segmental and somatic dysfunction of cervical region: Secondary | ICD-10-CM | POA: Diagnosis not present

## 2023-08-09 DIAGNOSIS — M542 Cervicalgia: Secondary | ICD-10-CM | POA: Diagnosis not present

## 2023-08-10 DIAGNOSIS — M5412 Radiculopathy, cervical region: Secondary | ICD-10-CM | POA: Diagnosis not present

## 2023-08-10 DIAGNOSIS — M9901 Segmental and somatic dysfunction of cervical region: Secondary | ICD-10-CM | POA: Diagnosis not present

## 2023-08-10 DIAGNOSIS — M542 Cervicalgia: Secondary | ICD-10-CM | POA: Diagnosis not present

## 2023-08-10 DIAGNOSIS — G44201 Tension-type headache, unspecified, intractable: Secondary | ICD-10-CM | POA: Diagnosis not present

## 2023-08-13 DIAGNOSIS — G44201 Tension-type headache, unspecified, intractable: Secondary | ICD-10-CM | POA: Diagnosis not present

## 2023-08-13 DIAGNOSIS — M5412 Radiculopathy, cervical region: Secondary | ICD-10-CM | POA: Diagnosis not present

## 2023-08-13 DIAGNOSIS — M9901 Segmental and somatic dysfunction of cervical region: Secondary | ICD-10-CM | POA: Diagnosis not present

## 2023-08-13 DIAGNOSIS — M542 Cervicalgia: Secondary | ICD-10-CM | POA: Diagnosis not present

## 2023-08-15 DIAGNOSIS — M9901 Segmental and somatic dysfunction of cervical region: Secondary | ICD-10-CM | POA: Diagnosis not present

## 2023-08-15 DIAGNOSIS — M5412 Radiculopathy, cervical region: Secondary | ICD-10-CM | POA: Diagnosis not present

## 2023-08-15 DIAGNOSIS — G44201 Tension-type headache, unspecified, intractable: Secondary | ICD-10-CM | POA: Diagnosis not present

## 2023-08-15 DIAGNOSIS — M542 Cervicalgia: Secondary | ICD-10-CM | POA: Diagnosis not present

## 2023-08-16 DIAGNOSIS — G44201 Tension-type headache, unspecified, intractable: Secondary | ICD-10-CM | POA: Diagnosis not present

## 2023-08-16 DIAGNOSIS — M5412 Radiculopathy, cervical region: Secondary | ICD-10-CM | POA: Diagnosis not present

## 2023-08-16 DIAGNOSIS — M542 Cervicalgia: Secondary | ICD-10-CM | POA: Diagnosis not present

## 2023-08-16 DIAGNOSIS — M9901 Segmental and somatic dysfunction of cervical region: Secondary | ICD-10-CM | POA: Diagnosis not present

## 2023-08-23 DIAGNOSIS — M542 Cervicalgia: Secondary | ICD-10-CM | POA: Diagnosis not present

## 2023-08-23 DIAGNOSIS — M5412 Radiculopathy, cervical region: Secondary | ICD-10-CM | POA: Diagnosis not present

## 2023-08-23 DIAGNOSIS — M9901 Segmental and somatic dysfunction of cervical region: Secondary | ICD-10-CM | POA: Diagnosis not present

## 2023-08-23 DIAGNOSIS — G44201 Tension-type headache, unspecified, intractable: Secondary | ICD-10-CM | POA: Diagnosis not present

## 2023-08-27 DIAGNOSIS — M542 Cervicalgia: Secondary | ICD-10-CM | POA: Diagnosis not present

## 2023-08-27 DIAGNOSIS — G44201 Tension-type headache, unspecified, intractable: Secondary | ICD-10-CM | POA: Diagnosis not present

## 2023-08-27 DIAGNOSIS — M9901 Segmental and somatic dysfunction of cervical region: Secondary | ICD-10-CM | POA: Diagnosis not present

## 2023-08-27 DIAGNOSIS — M5412 Radiculopathy, cervical region: Secondary | ICD-10-CM | POA: Diagnosis not present

## 2023-08-28 DIAGNOSIS — M542 Cervicalgia: Secondary | ICD-10-CM | POA: Diagnosis not present

## 2023-08-28 DIAGNOSIS — M9901 Segmental and somatic dysfunction of cervical region: Secondary | ICD-10-CM | POA: Diagnosis not present

## 2023-08-28 DIAGNOSIS — G44201 Tension-type headache, unspecified, intractable: Secondary | ICD-10-CM | POA: Diagnosis not present

## 2023-08-28 DIAGNOSIS — M5412 Radiculopathy, cervical region: Secondary | ICD-10-CM | POA: Diagnosis not present

## 2023-08-30 ENCOUNTER — Other Ambulatory Visit: Payer: Self-pay | Admitting: Adult Health

## 2023-08-31 ENCOUNTER — Ambulatory Visit: Payer: BC Managed Care – PPO | Admitting: Adult Health

## 2023-08-31 DIAGNOSIS — M542 Cervicalgia: Secondary | ICD-10-CM | POA: Diagnosis not present

## 2023-08-31 DIAGNOSIS — M9901 Segmental and somatic dysfunction of cervical region: Secondary | ICD-10-CM | POA: Diagnosis not present

## 2023-08-31 DIAGNOSIS — M5412 Radiculopathy, cervical region: Secondary | ICD-10-CM | POA: Diagnosis not present

## 2023-08-31 DIAGNOSIS — G44201 Tension-type headache, unspecified, intractable: Secondary | ICD-10-CM | POA: Diagnosis not present

## 2023-09-03 DIAGNOSIS — M9901 Segmental and somatic dysfunction of cervical region: Secondary | ICD-10-CM | POA: Diagnosis not present

## 2023-09-03 DIAGNOSIS — M542 Cervicalgia: Secondary | ICD-10-CM | POA: Diagnosis not present

## 2023-09-03 DIAGNOSIS — G44201 Tension-type headache, unspecified, intractable: Secondary | ICD-10-CM | POA: Diagnosis not present

## 2023-09-03 DIAGNOSIS — M5412 Radiculopathy, cervical region: Secondary | ICD-10-CM | POA: Diagnosis not present

## 2023-09-04 DIAGNOSIS — G44201 Tension-type headache, unspecified, intractable: Secondary | ICD-10-CM | POA: Diagnosis not present

## 2023-09-04 DIAGNOSIS — M9901 Segmental and somatic dysfunction of cervical region: Secondary | ICD-10-CM | POA: Diagnosis not present

## 2023-09-04 DIAGNOSIS — M542 Cervicalgia: Secondary | ICD-10-CM | POA: Diagnosis not present

## 2023-09-04 DIAGNOSIS — M5412 Radiculopathy, cervical region: Secondary | ICD-10-CM | POA: Diagnosis not present

## 2023-09-06 DIAGNOSIS — M542 Cervicalgia: Secondary | ICD-10-CM | POA: Diagnosis not present

## 2023-09-06 DIAGNOSIS — G44201 Tension-type headache, unspecified, intractable: Secondary | ICD-10-CM | POA: Diagnosis not present

## 2023-09-06 DIAGNOSIS — M5412 Radiculopathy, cervical region: Secondary | ICD-10-CM | POA: Diagnosis not present

## 2023-09-06 DIAGNOSIS — M9901 Segmental and somatic dysfunction of cervical region: Secondary | ICD-10-CM | POA: Diagnosis not present

## 2023-09-13 DIAGNOSIS — M9901 Segmental and somatic dysfunction of cervical region: Secondary | ICD-10-CM | POA: Diagnosis not present

## 2023-09-13 DIAGNOSIS — M542 Cervicalgia: Secondary | ICD-10-CM | POA: Diagnosis not present

## 2023-09-13 DIAGNOSIS — M5412 Radiculopathy, cervical region: Secondary | ICD-10-CM | POA: Diagnosis not present

## 2023-09-13 DIAGNOSIS — G44201 Tension-type headache, unspecified, intractable: Secondary | ICD-10-CM | POA: Diagnosis not present

## 2023-09-14 DIAGNOSIS — G44201 Tension-type headache, unspecified, intractable: Secondary | ICD-10-CM | POA: Diagnosis not present

## 2023-09-14 DIAGNOSIS — M542 Cervicalgia: Secondary | ICD-10-CM | POA: Diagnosis not present

## 2023-09-14 DIAGNOSIS — M9901 Segmental and somatic dysfunction of cervical region: Secondary | ICD-10-CM | POA: Diagnosis not present

## 2023-09-14 DIAGNOSIS — M5412 Radiculopathy, cervical region: Secondary | ICD-10-CM | POA: Diagnosis not present

## 2023-09-18 DIAGNOSIS — G44201 Tension-type headache, unspecified, intractable: Secondary | ICD-10-CM | POA: Diagnosis not present

## 2023-09-18 DIAGNOSIS — M5412 Radiculopathy, cervical region: Secondary | ICD-10-CM | POA: Diagnosis not present

## 2023-09-18 DIAGNOSIS — M9901 Segmental and somatic dysfunction of cervical region: Secondary | ICD-10-CM | POA: Diagnosis not present

## 2023-09-18 DIAGNOSIS — M542 Cervicalgia: Secondary | ICD-10-CM | POA: Diagnosis not present

## 2023-09-21 DIAGNOSIS — M542 Cervicalgia: Secondary | ICD-10-CM | POA: Diagnosis not present

## 2023-09-21 DIAGNOSIS — M5412 Radiculopathy, cervical region: Secondary | ICD-10-CM | POA: Diagnosis not present

## 2023-09-21 DIAGNOSIS — M9901 Segmental and somatic dysfunction of cervical region: Secondary | ICD-10-CM | POA: Diagnosis not present

## 2023-09-21 DIAGNOSIS — G44201 Tension-type headache, unspecified, intractable: Secondary | ICD-10-CM | POA: Diagnosis not present

## 2023-09-27 DIAGNOSIS — G44201 Tension-type headache, unspecified, intractable: Secondary | ICD-10-CM | POA: Diagnosis not present

## 2023-09-27 DIAGNOSIS — M5412 Radiculopathy, cervical region: Secondary | ICD-10-CM | POA: Diagnosis not present

## 2023-09-27 DIAGNOSIS — M9901 Segmental and somatic dysfunction of cervical region: Secondary | ICD-10-CM | POA: Diagnosis not present

## 2023-09-27 DIAGNOSIS — M542 Cervicalgia: Secondary | ICD-10-CM | POA: Diagnosis not present

## 2023-09-28 DIAGNOSIS — M9901 Segmental and somatic dysfunction of cervical region: Secondary | ICD-10-CM | POA: Diagnosis not present

## 2023-09-28 DIAGNOSIS — M542 Cervicalgia: Secondary | ICD-10-CM | POA: Diagnosis not present

## 2023-09-28 DIAGNOSIS — M5412 Radiculopathy, cervical region: Secondary | ICD-10-CM | POA: Diagnosis not present

## 2023-09-28 DIAGNOSIS — G44201 Tension-type headache, unspecified, intractable: Secondary | ICD-10-CM | POA: Diagnosis not present

## 2023-10-04 DIAGNOSIS — M5412 Radiculopathy, cervical region: Secondary | ICD-10-CM | POA: Diagnosis not present

## 2023-10-04 DIAGNOSIS — M542 Cervicalgia: Secondary | ICD-10-CM | POA: Diagnosis not present

## 2023-10-04 DIAGNOSIS — M9901 Segmental and somatic dysfunction of cervical region: Secondary | ICD-10-CM | POA: Diagnosis not present

## 2023-10-04 DIAGNOSIS — G44201 Tension-type headache, unspecified, intractable: Secondary | ICD-10-CM | POA: Diagnosis not present

## 2023-10-05 DIAGNOSIS — G44201 Tension-type headache, unspecified, intractable: Secondary | ICD-10-CM | POA: Diagnosis not present

## 2023-10-05 DIAGNOSIS — M542 Cervicalgia: Secondary | ICD-10-CM | POA: Diagnosis not present

## 2023-10-05 DIAGNOSIS — M5412 Radiculopathy, cervical region: Secondary | ICD-10-CM | POA: Diagnosis not present

## 2023-10-05 DIAGNOSIS — M9901 Segmental and somatic dysfunction of cervical region: Secondary | ICD-10-CM | POA: Diagnosis not present

## 2023-10-10 ENCOUNTER — Ambulatory Visit: Payer: BC Managed Care – PPO | Admitting: Adult Health

## 2023-11-08 DIAGNOSIS — N951 Menopausal and female climacteric states: Secondary | ICD-10-CM | POA: Diagnosis not present

## 2023-11-08 DIAGNOSIS — I10 Essential (primary) hypertension: Secondary | ICD-10-CM | POA: Diagnosis not present

## 2023-11-08 DIAGNOSIS — Z6829 Body mass index (BMI) 29.0-29.9, adult: Secondary | ICD-10-CM | POA: Diagnosis not present

## 2023-11-08 DIAGNOSIS — E6609 Other obesity due to excess calories: Secondary | ICD-10-CM | POA: Diagnosis not present

## 2023-12-18 ENCOUNTER — Ambulatory Visit: Admitting: Adult Health

## 2024-01-26 ENCOUNTER — Other Ambulatory Visit: Payer: Self-pay | Admitting: Adult Health

## 2024-02-15 ENCOUNTER — Ambulatory Visit: Admitting: Adult Health

## 2024-02-15 ENCOUNTER — Other Ambulatory Visit: Payer: Self-pay | Admitting: Adult Health

## 2024-02-15 ENCOUNTER — Encounter: Payer: Self-pay | Admitting: Adult Health

## 2024-02-15 VITALS — BP 169/107 | HR 99 | Ht 66.0 in | Wt 189.0 lb

## 2024-02-15 DIAGNOSIS — E875 Hyperkalemia: Secondary | ICD-10-CM | POA: Diagnosis not present

## 2024-02-15 DIAGNOSIS — Z1211 Encounter for screening for malignant neoplasm of colon: Secondary | ICD-10-CM | POA: Diagnosis not present

## 2024-02-15 DIAGNOSIS — Z3041 Encounter for surveillance of contraceptive pills: Secondary | ICD-10-CM | POA: Diagnosis not present

## 2024-02-15 DIAGNOSIS — Z1331 Encounter for screening for depression: Secondary | ICD-10-CM

## 2024-02-15 DIAGNOSIS — Z01419 Encounter for gynecological examination (general) (routine) without abnormal findings: Secondary | ICD-10-CM | POA: Diagnosis not present

## 2024-02-15 DIAGNOSIS — E7849 Other hyperlipidemia: Secondary | ICD-10-CM | POA: Diagnosis not present

## 2024-02-15 DIAGNOSIS — I10 Essential (primary) hypertension: Secondary | ICD-10-CM | POA: Diagnosis not present

## 2024-02-15 LAB — HEMOCCULT GUIAC POC 1CARD (OFFICE): Fecal Occult Blood, POC: NEGATIVE

## 2024-02-15 MED ORDER — AMLODIPINE BESYLATE 5 MG PO TABS: 5.0000 mg | ORAL_TABLET | Freq: Every day | ORAL | 3 refills | Status: DC

## 2024-02-15 MED ORDER — NORETHINDRONE 0.35 MG PO TABS: 1.0000 | ORAL_TABLET | Freq: Every day | ORAL | 3 refills | Status: AC

## 2024-02-15 NOTE — Progress Notes (Addendum)
 Patient ID: Andrea Liu, female   DOB: 08-14-1975, 48 y.o.   MRN: [id number] History of Present Illness: Andrea Liu is a 48 year old white female,married, G2P2002 in for a well woman gyn exam. Her middle daughter is leaving for [facility] next week and her mom passed this year, so she is emotional.       Component Value Date/Time   DIAGPAP  08/29/2022 1604    - Negative for intraepithelial lesion or malignancy (NILM)   DIAGPAP  06/18/2019 0854    - Negative for intraepithelial lesion or malignancy (NILM)   HPVHIGH Negative 08/29/2022 1604   HPVHIGH Negative 06/18/2019 0854   ADEQPAP  08/29/2022 1604    Satisfactory for evaluation; transformation zone component PRESENT.   ADEQPAP  06/18/2019 0854    Satisfactory for evaluation; transformation zone component PRESENT.    PCP is Lucie Mace PA    Current Medications, Allergies, Past Medical History, Past Surgical History, Family History and Social History were reviewed in [employer] record.     Review of Systems: Patient denies any headaches, hearing loss, fatigue, blurred vision, shortness of breath, chest pain, abdominal pain, problems with bowel movements, urination, or intercourse. No joint pain or mood swings.     Physical Exam:BP (!) 169/107 (BP Location: Left Arm, Patient Position: Sitting, Cuff Size: Normal)   Pulse 99   Ht 5' 6 (1.676 m)   Wt 189 lb (85.7 kg)   LMP 02/04/2024 (Approximate)   BMI 30.51 kg/m   has lost 30 lbs on Wegovy , BP was elevated at PCP too.  General:  Well developed, well nourished, no acute distress Skin:  Warm and dry Neck:  Midline trachea, normal thyroid , good ROM, no lymphadenopathy Lungs; Clear to auscultation bilaterally Breast:  No dominant palpable mass, retraction, or nipple discharge Cardiovascular: Regular rate and rhythm Abdomen:  Soft, non tender, no hepatosplenomegaly Pelvic:  External genitalia is normal in appearance, no lesions.  The vagina is normal in  appearance. Urethra has no lesions or masses. The cervix is smooth.  Uterus is felt to be normal size, shape, and contour.  No adnexal masses or tenderness noted.Bladder is non tender, no masses felt. Rectal: Good sphincter tone, no polyps, or hemorrhoids felt.  Hemoccult negative. Extremities/musculoskeletal:  No swelling or varicosities noted, no clubbing or cyanosis Psych:  No mood changes, alert and cooperative,seems happy AA is 0 Fall risk is low    02/15/2024    8:48 AM 08/29/2022    3:57 PM 05/13/2021    8:40 AM  Depression screen PHQ 2/9  Decreased Interest 0 0 0  Down, Depressed, Hopeless 0 1 1  PHQ - 2 Score 0 1 1  Altered sleeping 0 0 0  Tired, decreased energy 0 0 0  Change in appetite 0 1 0  Feeling bad or failure about yourself  0 0 1  Trouble concentrating 1 1 0  Moving slowly or fidgety/restless 0 0 0  Suicidal thoughts 0 0 0  PHQ-9 Score 1 3 2    On wellbutrin      02/15/2024    8:48 AM 08/29/2022    3:57 PM 05/13/2021    8:40 AM  GAD 7 : Generalized Anxiety Score  Nervous, Anxious, on Edge 0 1 0  Control/stop worrying 0 1 0  Worry too much - different things 0 1 0  Trouble relaxing 1 0 0  Restless 0 0 0  Easily annoyed or irritable 0 1 0  Afraid - awful might happen 0  0 0  Total GAD 7 Score 1 4 0      Upstream - 02/15/24 0844       Pregnancy Intention Screening   Does the patient want to become pregnant in the next year? No    Does the patient's partner want to become pregnant in the next year? No    Would the patient like to discuss contraceptive options today? No      Contraception Wrap Up   Current Method Oral Contraceptive    End Method Oral Contraceptive    Contraception Counseling Provided Yes         Examination chaperoned by Clarita Salt LPN  Impression and plan: 1. Encounter for well woman exam with routine gynecological exam (Primary) Pap in 2027 Physical in 1 year Get labs per PCP Mammogram was negative 06/13/23 Colonoscopy per GI   2.  Encounter for screening fecal occult blood testing Hemoccult was negative   - POCT occult blood stool  3. Hypertension, unspecified type Will rx Norvasc  5 mg 1 daily Watch salt and sugars  Review DASH diet Will recheck BP in 2 weeks    4. Encounter for surveillance of contraceptive pills Stop ortho-cyclen, and start Micronor , use condoms  Stop estrogen patch too Meds ordered this encounter  Medications   norethindrone  (MICRONOR ) 0.35 MG tablet    Sig: Take 1 tablet (0.35 mg total) by mouth daily.    Dispense:  84 tablet    Refill:  3    Supervising Provider:   JAYNE MINDER H [[id number]]   amLODipine  (NORVASC ) 5 MG tablet    Sig: Take 1 tablet (5 mg total) by mouth daily.    Dispense:  30 tablet    Refill:  3    Supervising Provider:   JAYNE MINDER H [[id number]]

## 2024-02-29 ENCOUNTER — Ambulatory Visit: Admitting: Adult Health

## 2024-02-29 ENCOUNTER — Encounter: Payer: Self-pay | Admitting: Adult Health

## 2024-02-29 VITALS — BP 150/88 | HR 99 | Ht 66.0 in | Wt 189.0 lb

## 2024-02-29 DIAGNOSIS — Z3041 Encounter for surveillance of contraceptive pills: Secondary | ICD-10-CM | POA: Diagnosis not present

## 2024-02-29 DIAGNOSIS — I10 Essential (primary) hypertension: Secondary | ICD-10-CM

## 2024-02-29 NOTE — Progress Notes (Signed)
  Subjective:     Patient ID: Andrea Liu, female   DOB: 1975/11/18, 48 y.o.   MRN: [id number]  HPI Andrea Liu is a 48 year old white female, married, G2P2002, in for follow up on BP,taking Norvasc  5 mg and changed BCP to Micronor , and feels great. No headaches, less achy, and less stressed. BP at hone 107/83, 111/82, 115/80.      Component Value Date/Time   DIAGPAP  08/29/2022 1604    - Negative for intraepithelial lesion or malignancy (NILM)   DIAGPAP  06/18/2019 0854    - Negative for intraepithelial lesion or malignancy (NILM)   HPVHIGH Negative 08/29/2022 1604   HPVHIGH Negative 06/18/2019 0854   ADEQPAP  08/29/2022 1604    Satisfactory for evaluation; transformation zone component PRESENT.   ADEQPAP  06/18/2019 0854    Satisfactory for evaluation; transformation zone component PRESENT.   PCP is GORMAN Mace PA  Review of Systems  feels great. No headaches, less achy, and less stressed feeling    Reviewed past medical,surgical, social and family history. Reviewed medications and allergies.  Objective:   Physical Exam BP (!) 150/88 (BP Location: Left Arm, Patient Position: Sitting, Cuff Size: Normal)   Pulse 99   Ht 5' 6 (1.676 m)   Wt 189 lb (85.7 kg)   LMP 02/04/2024 (Approximate)   BMI 30.51 kg/m     Skin warm and dry. Lungs: clear to ausculation bilaterally. Cardiovascular: regular rate and rhythm.   Upstream - 02/29/24 0936       Pregnancy Intention Screening   Does the patient want to become pregnant in the next year? No    Does the patient's partner want to become pregnant in the next year? No    Would the patient like to discuss contraceptive options today? No      Contraception Wrap Up   Current Method Oral Contraceptive    End Method Oral Contraceptive    Contraception Counseling Provided Yes          Assessment:     1. Hypertension, unspecified type (Primary) BP good at home Feels great Will continue Norvasc  5 mg, has refills  Will recheck in 8  weeks in office, but keep taking at home   2. Encounter for surveillance of contraceptive pills Good with micronor  so far, has refills      Plan:     Follow up in 8 weeks for BP check and ROS

## 2024-04-01 ENCOUNTER — Telehealth: Admitting: Physician Assistant

## 2024-04-01 DIAGNOSIS — B9689 Other specified bacterial agents as the cause of diseases classified elsewhere: Secondary | ICD-10-CM | POA: Diagnosis not present

## 2024-04-01 DIAGNOSIS — J019 Acute sinusitis, unspecified: Secondary | ICD-10-CM | POA: Diagnosis not present

## 2024-04-01 MED ORDER — AMOXICILLIN-POT CLAVULANATE 875-125 MG PO TABS: 1.0000 | ORAL_TABLET | Freq: Two times a day (BID) | ORAL | 0 refills | Status: DC

## 2024-04-01 NOTE — Progress Notes (Signed)

## 2024-04-01 NOTE — Progress Notes (Signed)
 I have spent 5 minutes in review of e-visit questionnaire, review and updating patient chart, medical decision making and response to patient.   Elsie Velma Lunger, PA-C

## 2024-04-29 ENCOUNTER — Ambulatory Visit: Admitting: Adult Health

## 2024-05-02 ENCOUNTER — Ambulatory Visit: Admitting: Adult Health

## 2024-05-06 ENCOUNTER — Other Ambulatory Visit (HOSPITAL_COMMUNITY): Payer: Self-pay | Admitting: Adult Health

## 2024-05-06 DIAGNOSIS — Z1231 Encounter for screening mammogram for malignant neoplasm of breast: Secondary | ICD-10-CM

## 2024-05-07 ENCOUNTER — Ambulatory Visit: Admitting: Adult Health

## 2024-05-15 ENCOUNTER — Ambulatory Visit: Admitting: Adult Health

## 2024-05-15 ENCOUNTER — Encounter: Payer: Self-pay | Admitting: Adult Health

## 2024-05-15 VITALS — BP 131/84 | HR 86 | Ht 67.0 in | Wt 189.5 lb

## 2024-05-15 DIAGNOSIS — I10 Essential (primary) hypertension: Secondary | ICD-10-CM | POA: Diagnosis not present

## 2024-05-15 MED ORDER — AMLODIPINE BESYLATE 5 MG PO TABS: 5.0000 mg | ORAL_TABLET | Freq: Every day | ORAL | 6 refills | Status: AC

## 2024-05-15 NOTE — Progress Notes (Signed)
  Subjective:     Patient ID: Andrea Liu, female   DOB: Apr 06, 1976, 48 y.o.   MRN: [id number]  HPI Andrea Liu is a 48 year old white female, married, G2P2002, in for follow up on BP and taking Norvasc  5 mg and on Micronor  now and doing good, BP was 106/76 at home this morning.     Component Value Date/Time   DIAGPAP  08/29/2022 1604    - Negative for intraepithelial lesion or malignancy (NILM)   DIAGPAP  06/18/2019 0854    - Negative for intraepithelial lesion or malignancy (NILM)   HPVHIGH Negative 08/29/2022 1604   HPVHIGH Negative 06/18/2019 0854   ADEQPAP  08/29/2022 1604    Satisfactory for evaluation; transformation zone component PRESENT.   ADEQPAP  06/18/2019 0854    Satisfactory for evaluation; transformation zone component PRESENT.    PCP is GORMAN Mace PA Review of Systems Feels good, no headaches +Skipped period on Micronor  Reviewed past medical,surgical, social and family history. Reviewed medications and allergies.     Objective:   Physical Exam BP 131/84 (BP Location: Right Arm, Patient Position: Sitting, Cuff Size: Normal)   Pulse 86   Ht 5' 7 (1.702 m)   Wt 189 lb 8 oz (86 kg)   LMP 03/13/2024 (Approximate)   BMI 29.68 kg/m   she has lost about 30 lbs on GLP1   Skin warm and dry.  Lungs: clear to ausculation bilaterally. Cardiovascular: regular rate and rhythm.   Upstream - 05/15/24 0839       Pregnancy Intention Screening   Does the patient want to become pregnant in the next year? No    Does the patient's partner want to become pregnant in the next year? No    Would the patient like to discuss contraceptive options today? No      Contraception Wrap Up   Current Method Oral Contraceptive    End Method Oral Contraceptive    Contraception Counseling Provided Yes          Assessment:     1. Hypertension, unspecified type (Primary) BP good on Norvasc  5 mg 1 daily, will refill Meds ordered this encounter  Medications   amLODipine  (NORVASC ) 5 MG  tablet    Sig: Take 1 tablet (5 mg total) by mouth daily.    Dispense:  30 tablet    Refill:  6    Supervising Provider:   JAYNE VONN DEL [[id number]]       Plan:     Follow up in 6 months or sooner if needed

## 2024-05-16 DIAGNOSIS — F419 Anxiety disorder, unspecified: Secondary | ICD-10-CM | POA: Diagnosis not present

## 2024-06-06 ENCOUNTER — Other Ambulatory Visit: Payer: Self-pay | Admitting: Medical Genetics

## 2024-06-13 ENCOUNTER — Other Ambulatory Visit (HOSPITAL_COMMUNITY)

## 2024-06-13 ENCOUNTER — Ambulatory Visit (HOSPITAL_COMMUNITY)

## 2024-06-13 DIAGNOSIS — F419 Anxiety disorder, unspecified: Secondary | ICD-10-CM | POA: Diagnosis not present

## 2024-06-18 ENCOUNTER — Other Ambulatory Visit: Payer: Self-pay | Admitting: Women's Health

## 2024-06-19 ENCOUNTER — Other Ambulatory Visit (HOSPITAL_COMMUNITY)

## 2024-06-19 ENCOUNTER — Ambulatory Visit (HOSPITAL_COMMUNITY)

## 2024-06-23 ENCOUNTER — Other Ambulatory Visit: Payer: Self-pay | Admitting: Adult Health

## 2024-06-26 ENCOUNTER — Encounter (HOSPITAL_COMMUNITY): Payer: Self-pay

## 2024-06-26 ENCOUNTER — Inpatient Hospital Stay (HOSPITAL_COMMUNITY): Admission: RE | Admit: 2024-06-26 | Discharge: 2024-06-26 | Attending: Adult Health | Admitting: Adult Health

## 2024-06-26 DIAGNOSIS — Z1231 Encounter for screening mammogram for malignant neoplasm of breast: Secondary | ICD-10-CM | POA: Diagnosis not present

## 2024-07-02 ENCOUNTER — Ambulatory Visit: Payer: Self-pay | Admitting: Adult Health

## 2024-07-07 ENCOUNTER — Other Ambulatory Visit (HOSPITAL_COMMUNITY)

## 2024-07-08 ENCOUNTER — Other Ambulatory Visit (HOSPITAL_COMMUNITY)

## 2024-07-08 ENCOUNTER — Other Ambulatory Visit (HOSPITAL_COMMUNITY)
Admission: RE | Admit: 2024-07-08 | Discharge: 2024-07-08 | Disposition: A | Discharge status: Home / Self Care | Payer: Self-pay | Source: Ambulatory Visit | Attending: Medical Genetics | Admitting: Medical Genetics

## 2024-07-23 LAB — GENECONNECT MOLECULAR SCREEN: Genetic Analysis Overall Interpretation: NEGATIVE

## 2024-07-30 ENCOUNTER — Other Ambulatory Visit: Payer: Self-pay | Admitting: Adult Health
